# Patient Record
Sex: Female | Born: 1980 | Race: Black or African American | Hispanic: No | Marital: Single | Smoking: Never smoker
Health system: Southern US, Community
[De-identification: ages and names within clinical notes are randomized; demographics above are authoritative.]

## PROBLEM LIST (undated history)

## (undated) ENCOUNTER — Inpatient Hospital Stay (HOSPITAL_COMMUNITY): Payer: Self-pay

## (undated) DIAGNOSIS — T7840XA Allergy, unspecified, initial encounter: Secondary | ICD-10-CM

## (undated) DIAGNOSIS — N611 Abscess of the breast and nipple: Secondary | ICD-10-CM

## (undated) DIAGNOSIS — E282 Polycystic ovarian syndrome: Secondary | ICD-10-CM

## (undated) DIAGNOSIS — D649 Anemia, unspecified: Secondary | ICD-10-CM

## (undated) HISTORY — PX: BREAST SURGERY: SHX581

## (undated) HISTORY — DX: Polycystic ovarian syndrome: E28.2

## (undated) HISTORY — DX: Allergy, unspecified, initial encounter: T78.40XA

## (undated) HISTORY — DX: Abscess of the breast and nipple: N61.1

---

## 2006-09-30 ENCOUNTER — Emergency Department (HOSPITAL_COMMUNITY): Admission: EM | Admit: 2006-09-30 | Discharge: 2006-09-30 | Payer: Self-pay | Admitting: Emergency Medicine

## 2008-09-15 ENCOUNTER — Inpatient Hospital Stay (HOSPITAL_COMMUNITY): Admission: AD | Admit: 2008-09-15 | Discharge: 2008-09-15 | Payer: Self-pay | Admitting: Obstetrics

## 2009-03-04 ENCOUNTER — Inpatient Hospital Stay (HOSPITAL_COMMUNITY): Admission: AD | Admit: 2009-03-04 | Discharge: 2009-03-04 | Payer: Self-pay | Admitting: Obstetrics and Gynecology

## 2009-03-06 ENCOUNTER — Inpatient Hospital Stay (HOSPITAL_COMMUNITY): Admission: AD | Admit: 2009-03-06 | Discharge: 2009-03-06 | Payer: Self-pay | Admitting: Obstetrics and Gynecology

## 2009-03-15 ENCOUNTER — Inpatient Hospital Stay (HOSPITAL_COMMUNITY): Admission: AD | Admit: 2009-03-15 | Discharge: 2009-03-15 | Payer: Self-pay | Admitting: Obstetrics and Gynecology

## 2009-03-19 ENCOUNTER — Inpatient Hospital Stay (HOSPITAL_COMMUNITY): Admission: AD | Admit: 2009-03-19 | Discharge: 2009-03-22 | Payer: Self-pay | Admitting: Obstetrics and Gynecology

## 2009-03-19 ENCOUNTER — Encounter (INDEPENDENT_AMBULATORY_CARE_PROVIDER_SITE_OTHER): Payer: Self-pay | Admitting: Obstetrics and Gynecology

## 2010-09-22 ENCOUNTER — Emergency Department (HOSPITAL_COMMUNITY)
Admission: EM | Admit: 2010-09-22 | Discharge: 2010-09-22 | Payer: Self-pay | Source: Home / Self Care | Admitting: Emergency Medicine

## 2010-12-10 LAB — CBC
HCT: 38.8 % (ref 36.0–46.0)
MCV: 95.4 fL (ref 78.0–100.0)
MCV: 96.5 fL (ref 78.0–100.0)
Platelets: 300 10*3/uL (ref 150–400)
Platelets: 357 10*3/uL (ref 150–400)
RBC: 3.39 MIL/uL — ABNORMAL LOW (ref 3.87–5.11)
RDW: 13.2 % (ref 11.5–15.5)
WBC: 14.2 10*3/uL — ABNORMAL HIGH (ref 4.0–10.5)

## 2010-12-10 LAB — RPR: RPR Ser Ql: NONREACTIVE

## 2010-12-10 LAB — URINALYSIS, ROUTINE W REFLEX MICROSCOPIC
Bilirubin Urine: NEGATIVE
Glucose, UA: NEGATIVE mg/dL
Nitrite: NEGATIVE
Nitrite: NEGATIVE
Specific Gravity, Urine: >1.03 — ABNORMAL HIGH (ref 1.005–1.030)
pH: 5.5 (ref 5.0–8.0)
pH: 6 (ref 5.0–8.0)

## 2010-12-10 LAB — WET PREP, GENITAL

## 2010-12-10 LAB — URINE CULTURE

## 2010-12-10 LAB — URINE MICROSCOPIC-ADD ON: RBC / HPF: NONE SEEN RBC/hpf (ref <3)

## 2011-01-16 NOTE — H&P (Signed)
[name], [name]             ACCOUNT NO.:  [id number]   MEDICAL RECORD NO.:  [id number]          PATIENT TYPE:  INP   LOCATION:  9162                          [facility]   PHYSICIAN:  Osborn Coho, M.D.   DATE OF BIRTH:  08/07/1981   DATE OF ADMISSION:  03/19/2009  DATE OF DISCHARGE:                              HISTORY & PHYSICAL   HISTORY OF PRESENT ILLNESS:  The patient is a 30 year old gravida 1,  para 0, admitted to 47 and 0/7 weeks' gestation for elective induction  of labor secondary to postdates.  The patient reports occasional mild  uterine contractions.  The patient denies leakage of fluid or bleeding.  The patient reports her fetus has been moving normally.  The patient  reports intermittent irregular contractions over the last few weeks and  has been evaluated multiple times in maternity admissions since 39 weeks  for contractions.  Pregnancy remarkable for:  1. Transfer in at 21 weeks.  2. Negative group B strep.  3. Alopecia areata.  4. History of asthma.  5. History of migraines.  6. History of gonorrhea.  7. IODINE and LATEX allergy.  8. Failed 1-hour glucose tolerance test and normal 3-hour glucose      tolerance test.   The patient's pregnancy has been followed by the [facility].  History present pregnancy, the patient entered care at  [facility] at 21 weeks.  The patient reports that prior to that time she had  seen Dr. Gaynell Face for 2 visits only.  Anatomy ultrasound obtained at 21  weeks with normal fetal anatomy noted.  Prenatal labs were done at that  time as well.  Normal amniotic fluid present at her 21-week scan as  well.  At 25 weeks, the patient expressed concern about weight gain and  nutrition was discussed.  The patient with a history of migraines and  stated that she was not any having issues with migraines at that time.  The patient was undecided regarding H1N1 vaccine.  Appropriate growth in  the fundal height.   Ultrasound was re-obtained due to size greater than  dates and estimated fetal weight at the 51st percentile and normal  amniotic fluid.  Urine culture was obtained at that time and the culture  returned with E. coli and the patient was treated with a 7-day course of  Macrobid b.i.d.  At 28 weeks, the patient had 1-hour Glucola done.  The  patient also had 2+ leukocytes on urine and repeat culture was sent at  that time as well.  The patient's 28-week labs with Glucola results of  163, hemoglobin 11.7, and negative RPR, and the patient underwent 3-hour  Glucola at 32 weeks of gestation.  At 31 weeks, the patient's urine test  of cure with no gross.  At 33 weeks, the patient with complaints of a  boil on the inside of her right thigh.  The patient was seen and noted  to have healing entered tinnitus lesions.  The patient was treated with  Keflex b.i.d. for 7 days.  Cervix remained closed.  The patient  reported  loss of mucous plug at 33 weeks.  Three-hour Glucola results noted to be  within normal limits at 33 weeks.  At 35 weeks, ultrasound was obtained  for position and growth.  Placenta was noted to be posterior, amniotic  fluid index was within normal limits.  No other information noted at  that time.  Group B strep, gonorrhea and Chlamydia were obtained at 35  weeks.  At 36 weeks, the patient with complaints of increased work  distress.  The patient's gonorrhea, Chlamydia, and group B strep, RPR  were all negative.  At 37 weeks, the patient with no complaints.  At 37  weeks and 3 days, the patient with complaints of contractions and  possible leakage of fluid; however, the patient sterile speculum exam  was negative, and the patient with no cervical dilation at that time.  At 38 weeks, the patient was noted to have 1+ glycosuria and a random  CBG was obtained with a result of 150; however, the patient had eaten  immediately prior to visit with a bagel with cream cheese, bacon and   orange juice.  Reactive NST was obtained at that date due to decreased  fetal movement.  Biophysical was also obtained with results of 8/10.  Amniotic fluid index was at the 90th percentile with an AFI of 22 at 38  weeks.  At 38 weeks and 4 days, the patient with a complaint of  recurrent boil at the beginning of her inner thigh.  The patient was  started on Keflex as well as warm compresses.  At 39 weeks, the patient  was seen in maternity admissions, evaluated for possible spontaneous  rupture of membranes, which was negative.  The patient was treated for  bacterial vaginosis at that time.  Urine culture was also obtained.  The  patient was treated with Flagyl for bacterial vaginosis.  At 39 weeks  and 4 days, the patient with complaints of irregular contractions and  had been seen several times for rule out labor.  The patient's cervical  exam at that time 1-2, 60-70% and -2 station.  At 40 weeks and 2 days,  the patient with continued complaints of irregular contractions.  The  patient's cervix found to be 3 cm dilated, 70% effaced, -2 station, and  vertex with bulging membranes.  The patient's membranes first left at  that point in time.  At that time, the patient expressed desire for  induction of labor and the patient was posted for induction of labor on  March 19, 2009.  The patient's total weight gain during this pregnancy 32  pounds.  The patient's [place] Day on March 12, 2009 and that was established by  ultrasound.   OB HISTORY:  Pregnancy #1 is current.   GYN HISTORY:  The patient with a history of gonorrhea 7 years ago and it  was treated.  The patient with no other history of abnormal Pap or STDs.  The patient with regular monthly menses and no history of contraceptives  use.   PAST MEDICAL HISTORY:  The patient with a history of asthma with rare  issues.  At present; however, she does have a rescue inhaler available  for use.  The patient also with a history of alopecia areata  since  birth.  The patient also with a history of migraine headaches with no  prophylaxis and no current issues.  The patient also with some anxiety  with pelvic exams.   PAST SURGICAL HISTORY:  Negative.   HOSPITALIZATIONS:  Negative.   CURRENT MEDICATIONS:  Prenatal vitamins.   ALLERGIES:  The patient is allergic to IODINE, SHELL FISH, and LATEX.   FAMILY HISTORY:  The patient and her other family members also with  alopecia areata, but no specific family members are not noted.   GENETIC HISTORY:  Negative with exception of alopecia areata.   SOCIAL HISTORY:  The patient is single.  The patient works as a Advice worker.  The patient with associate degree education.  The patient  denies use of tobacco, alcohol, or street drugs.  The patient reports no  particular religious affiliation.  Father of the baby, Nunzio Cobbs,  is involved and supportive.  Father of the baby with associate degree  education and at present is unemployed.  The patient's domestic violence  screen is negative.   OBJECTIVE:  VITAL SIGNS:  Blood pressure 120/59, pulse 110, respirations  16, and heart rate regular.  The patient is afebrile.  GENERAL:  The patient is alert and oriented.  No apparent distress.  SKIN:  Warm and dry.  Color satisfactory.  HEENT:  Within normal limits.  THYROID:  Not enlarged.  HEART:  Regular rate and rhythm.  LUNGS:  Clear.  BREASTS:  Soft.  ABDOMEN:  Soft and gravid and nontender.  Fundal height 41 cm.  Fetus is  vertex to [facility] maneuver and vertex presentation confirmed with brief  bedside ultrasound as well.  Estimated fetal weight 8-1/2 pound.  Fetal  heart rate baseline 140 and variability is present.  No decelerations  are noted.  Accelerations are present and fetal heart rate has reactive  fetal heart rate tracing.  Uterine contractions are noted per toco every  4-9 minutes and mild to palpation.  Sterile vaginal exam of the cervix  found cervix 3 cm  dilated, 60% effaced, -2 station, vertex and then  position with a Bishop score of 8.  Vertex not well applied to the  cervix at the present time.  EXTREMITIES:  Negative edema.  Negative  Homans bilaterally.  Deep tendon reflexes are 2+ with no clonus.   ASSESSMENT:  1. Intrauterine pregnancy at 96 and 0/7 weeks.  2. Induction of labor secondary to postdates.   PLAN:  Consult was obtained from Dr. Su Hilt.  The patient to be  admitted to birthing suite for induction of labor due to postdates.  The  risks, benefits, alternatives, and induction of labor were discussed  with the patient including increased risk of cesarean section and the  patient desires to proceed at the present time.  The patient will be  started on low-dose Pitocin.  Routine CNM orders.  The patient desires  epidural during labor.      Taylor Klein, CNM      Osborn Coho, M.D.  Electronically Signed    NOS/MEDQ  D:  03/19/2009  T:  03/19/2009  Job:  [id number]

## 2011-01-16 NOTE — Op Note (Signed)
NAMESEPTEMBER, Taylor Klein             ACCOUNT NO.:  [id number]   MEDICAL RECORD NO.:  [id number]          PATIENT TYPE:  INP   LOCATION:  9125                          [facility]   PHYSICIAN:  Osborn Coho, M.D.   DATE OF BIRTH:  [birth year]   DATE OF PROCEDURE:  03/19/2009  DATE OF DISCHARGE:                               OPERATIVE REPORT   PREOPERATIVE DIAGNOSES:  1. Term intrauterine pregnancy.  2. Postdates induction.  3. Failure to progress.   POSTOPERATIVE DIAGNOSES:  1. Term intrauterine pregnancy.  2. Postdates induction.  3. Failure to progress.   PROCEDURE:  Primary low transverse cesarean section.   ATTENDING:  Osborn Coho, MD   ANESTHESIA:  Failed epidural, failed spinal, ultimately required general  via endotracheal tube.   FINDINGS:  Live female infant with Apgars of 9 at 1 minute and 9 at 5  minutes, weighing 6 pounds 6 ounces.   SPECIMEN TO PATHOLOGY:  Placenta.   FLUIDS:  2300 mL.   URINE OUTPUT:  70 mL.   ESTIMATED BLOOD LOSS:  700 mL.   COMPLICATIONS:  None.   DESCRIPTION OF PROCEDURE:  The patient was taken to the operating room  after the risks, benefits, and alternatives were discussed with the  patient.  The patient verbalized understanding and consent signed and  witnessed.  The patient was ultimately placed under general after the  epidural was noted to not be working adequately and a spinal was  attempted and failed as well.  The patient was already prepped and  draped in the normal sterile fashion.  A Pfannenstiel skin incision was  made and carried down to underlying layer of fascia with the scalpel.  The fascia was excised bilaterally in the midline with a scalpel,  extended bilaterally with the scalpel.  The Kocher clamps were placed on  the superior aspect of fascial incision and the rectus muscle excised  from the fascia.  The same was done on the inferior aspect of the  fascial incision.  The muscle was separated in midline and  the  peritoneum entered bluntly and extended manually.  The bladder blade was  placed and bladder flap created with the Metzenbaum scissors.  Uterine  incision was made with a scalpel, extended bilaterally with the bandage  scissors.  The infant was delivered in vertex presentation and the cord  was clamped and cut, and the infant handed to awaiting pediatricians.  The placenta was removed via fundal massage and the uterus cleared of  all clots and debris.  The uterine incision was repaired with 0 Vicryl  via a running interlocking stitch and a second imbricating layer was  performed.  A small approximately 1.5 cm extension was noted on the  right aspect of the uterine incision, which was repaired with 0 Vicryl  via a running interlocking stitch and a second imbricating layer was  performed as well.  This was done prior to placing the second  imbricating layer of the primary incision.  The intra-abdominal cavity  was copiously irrigated and normal-appearing bilateral ovaries and  fallopian tubes were noted.  The uterine incision  was noted to be  hemostatic.  The peritoneum was entered with 2-0 chromic via a running  stitch.  The fascia was repaired via 0 Vicryl via a running stitch.  The  subcutaneous tissue was irrigated and made hemostatic with the Bovie.  A  JP drain was placed and subcutaneous tissue was reapproximated using 3  interrupted stitches of 2-0 plain.  The skin was reapproximated using 3-  0 Monocryl via a subcuticular stitch.  The JP drain was sutured down  using 2-0 silk.  Steri-Strips were applied with Benzoin.  Sponge, lap,  and needle count was correct.  The patient tolerated the procedure well  and was awaiting extubation and transfer to the recovery room in good  condition.       Osborn Coho, M.D.  Electronically Signed     AR/MEDQ  D:  03/19/2009  T:  03/20/2009  Job:  [id number]

## 2011-01-16 NOTE — Discharge Summary (Signed)
NAMEMAKINZE, JANI             ACCOUNT NO.:  [id number]   MEDICAL RECORD NO.:  [id number]          PATIENT TYPE:  INP   LOCATION:  9125                          [facility]   PHYSICIAN:  Osborn Coho, M.D.   DATE OF BIRTH:  [birth year]   DATE OF ADMISSION:  03/19/2009  DATE OF DISCHARGE:  03/22/2009                               DISCHARGE SUMMARY   ADMITTING DIAGNOSES:  1. Intrauterine pregnancy at 41 weeks.  2. Induction of labor secondary to post dates.   DISCHARGE DIAGNOSES:  1. Intrauterine pregnancy at term.  2. Failure to progress.   PROCEDURES:  1. Primary low-transverse cesarean section.  2. Failed induction.  3. Failed epidural.  4. Failed spinal and general anesthesia.   HOSPITAL COURSE:  Ms. Behringer is a 30 year old, gravida 1, para 0, who  was admitted on March 19, 2009 in the morning for induction secondary to  post dates.  Her pregnancy had been remarkable for:  1. Transfer of care to [facility] at 21 weeks.  2. Alopecia areata.  3. Negative group B strep.  4. Asthma.  5. History of migraines.  6. History of gonorrhea in the past.  7. IODINE and LATEX allergy.  8. Failed 1-hour GTT, but normal 3-hour GTT.   On admission, cervix was 360% vertex at a -2 station.  Fetal heart rate  was reactive.  Contractions every 4-9 minutes.  The patient was begun on  Pitocin that morning.  Contractions became stronger over the early  afternoon.  She was requesting an epidural by approximately 3:00 p.m.  She was on 10 units of Pitocin at that time.  Fetal heart rate was  reactive and contractions every 2-3 minutes.  Epidural was placed and  the patient was 4-5 80% vertex at a -2 station.  Bulging bag of water  was noted.  Artificial rupture of membranes was accomplished with clear  fluid and a pressure monitor was inserted without difficulty.  Over the  next 2-3 hours, the patient began to have significant neck pain, since  her epidural placement and  epidural was turned off.  The patient was  still 4-5 days and she had been adequate since insertion of the IUPC at  3:30.  Anesthesia was consulted, at the time, the cervix was 5-6 at 8:00  p.m., 80% vertex at a -2 station.  Fetal heart rate was reactive.  There  were some mild variables noted.  The patient had been inadequate labor  for several hours without cervical change.  Options were reviewed with  the patient including continued induction of labor versus C-section for  failure to progress.  The patient started to proceed with C-section at  that time.  Dr. Su Hilt was notified it and that was notified.  She came  in performed a primary low-transverse cesarean section.  The epidural  that was present failed for adequacy of use.  The spinal that was  attempted also failed, therefore, the patient had to have general  anesthesia.  Findings were a viable female, weight 6 pounds 6 ounces.  Apgars were 9 and 9.  Infant  was taken to the full-term nursery.  Mother  was taken to recovery in good condition.  By postop day 1, the patient  was doing well.  She was having good pain control with p.o. pain meds.  Her hemoglobin was 11.3, white blood cell count was 18.6, and platelet  count was 300.  Her pre delivery hemoglobin had been 13.5.  Her physical  exam was within normal limits.  She had a JP drain in place that was  draining a small amount of serosanguineous fluid.  The dressing was  clean, dry, and intact.  Her lochia was scant and her fundus was firm.  She was working on breast-feeding.  Her Foley was removed without  difficulty.  Over the rest of her hospital stay, she continued to work  on her breast-feeding.  She did not have any issues of syncope or  dizziness.  She was ambulating well without difficulty.  By postop day  3, she was up ad lib.  The drain was removed without difficulty.  She  was planning to use Micronor at present, since she was still working on  breast-feeding.  Her  incision was clean, dry, and intact with Steri-  Strips in place.  Her lochia was scant.  Her fundus was firm.  She was  deemed to receive full-benefit of her hospital stay and was discharged  home.   DISCHARGE INSTRUCTIONS:  Per [facility] handout.   DISCHARGE MEDICATIONS:  Motrin 600 mg p.o. q.6 h p.r.n. pain, Percocet  5/325 one to two p.o. daily 3-4 hours p.r.n. pain, Micronor 1 p.o.  daily.  The patient will also notify us if she decides to stop breast-  feeding first to make it change in her birth control pills.  Discharge  followup will occur in 6 weeks [facility].      Renaldo Reel Emilee Hero, C.N.M.      Osborn Coho, M.D.  Electronically Signed    VLL/MEDQ  D:  03/22/2009  T:  03/23/2009  Job:  [id number]

## 2011-03-06 ENCOUNTER — Ambulatory Visit: Payer: Self-pay

## 2011-03-06 ENCOUNTER — Other Ambulatory Visit: Payer: Self-pay | Admitting: Occupational Medicine

## 2011-03-06 DIAGNOSIS — R52 Pain, unspecified: Secondary | ICD-10-CM

## 2011-07-03 ENCOUNTER — Emergency Department (HOSPITAL_COMMUNITY)
Admission: EM | Admit: 2011-07-03 | Discharge: 2011-07-04 | Disposition: A | Discharge status: Home / Self Care | Payer: Self-pay | Attending: Emergency Medicine | Admitting: Emergency Medicine

## 2011-07-03 DIAGNOSIS — N644 Mastodynia: Secondary | ICD-10-CM | POA: Insufficient documentation

## 2011-07-03 DIAGNOSIS — J45909 Unspecified asthma, uncomplicated: Secondary | ICD-10-CM | POA: Insufficient documentation

## 2011-07-03 DIAGNOSIS — N63 Unspecified lump in unspecified breast: Secondary | ICD-10-CM | POA: Insufficient documentation

## 2011-07-04 LAB — CBC
HCT: 38.5 % (ref 36.0–46.0)
MCH: 27.4 pg (ref 26.0–34.0)
MCHC: 30.9 g/dL (ref 30.0–36.0)
RDW: 12.6 % (ref 11.5–15.5)

## 2011-07-04 LAB — DIFFERENTIAL
Basophils Absolute: 0 10*3/uL (ref 0.0–0.1)
Basophils Relative: 0 % (ref 0–1)
Eosinophils Relative: 2 % (ref 0–5)
Lymphocytes Relative: 20 % (ref 12–46)
Monocytes Absolute: 0.7 10*3/uL (ref 0.1–1.0)
Monocytes Relative: 6 % (ref 3–12)

## 2011-07-05 ENCOUNTER — Other Ambulatory Visit: Payer: Self-pay | Admitting: Obstetrics and Gynecology

## 2011-07-05 DIAGNOSIS — N63 Unspecified lump in unspecified breast: Secondary | ICD-10-CM

## 2011-07-05 DIAGNOSIS — N644 Mastodynia: Secondary | ICD-10-CM

## 2011-07-09 ENCOUNTER — Ambulatory Visit
Admission: RE | Admit: 2011-07-09 | Discharge: 2011-07-09 | Disposition: A | Discharge status: Home / Self Care | Payer: BC Managed Care – PPO | Source: Ambulatory Visit | Attending: Obstetrics and Gynecology | Admitting: Obstetrics and Gynecology

## 2011-07-09 ENCOUNTER — Other Ambulatory Visit: Payer: Self-pay | Admitting: Diagnostic Radiology

## 2011-07-09 ENCOUNTER — Other Ambulatory Visit: Payer: Self-pay | Admitting: Obstetrics and Gynecology

## 2011-07-09 DIAGNOSIS — N63 Unspecified lump in unspecified breast: Secondary | ICD-10-CM

## 2011-07-09 DIAGNOSIS — N644 Mastodynia: Secondary | ICD-10-CM

## 2011-08-14 ENCOUNTER — Encounter (INDEPENDENT_AMBULATORY_CARE_PROVIDER_SITE_OTHER): Payer: Self-pay | Admitting: Surgery

## 2011-08-14 ENCOUNTER — Ambulatory Visit (INDEPENDENT_AMBULATORY_CARE_PROVIDER_SITE_OTHER): Payer: BC Managed Care – PPO | Admitting: Surgery

## 2011-08-14 ENCOUNTER — Other Ambulatory Visit (INDEPENDENT_AMBULATORY_CARE_PROVIDER_SITE_OTHER): Payer: Self-pay | Admitting: Surgery

## 2011-08-14 VITALS — BP 132/90 | HR 88 | Temp 98.2°F | Resp 16 | Ht 60.0 in | Wt 204.2 lb

## 2011-08-14 DIAGNOSIS — N61 Mastitis without abscess: Secondary | ICD-10-CM

## 2011-08-14 DIAGNOSIS — N611 Abscess of the breast and nipple: Secondary | ICD-10-CM | POA: Insufficient documentation

## 2011-08-14 MED ORDER — HYDROCODONE-ACETAMINOPHEN 5-500 MG PO TABS: 1.0000 | ORAL_TABLET | Freq: Every day | ORAL | Status: AC

## 2011-08-14 NOTE — Patient Instructions (Addendum)
Mastitis   Mastitis is a bacterial infection of the breast tissue.  CAUSES   Bacteria causes infection by entering the breast tissue through cuts or openings in the skin. Typically, this occurs with breastfeeding due to cracked or irritated skin. It can be associated with plugged ducts. Nipple piercing can also lead to mastitis.  SYMPTOMS   In mastitis, an area of the breast becomes swollen, red, tender, and painful. You may notice you have a fever and swelling of the glands under your arm on that side. If the infection is allowed to progress, a collection of pus (abscess) may develop.  DIAGNOSIS   Your caregiver can diagnose mastitis based on your symptoms and upon examination. The diagnosis can be confirmed if pus can be expressed from the breast. This pus can be examined in the lab to determine which bacteria are present. If an abscess has developed, the fluid in the abscess can be removed with a needle. This is used to confirm the diagnosis and determine the bacteria present. In most cases, pus will not be present. Blood tests can be done to determine if your body is fighting a bacterial infection. Sometimes, a mammogram or ultrasound will be recommended to exclude other breast diseases including cancer.  Other rare forms of mastitis:   Tuberculosis mastitis is rare. The TB germ can affect the breast if it is present in some other part of the body. The breast may be slightly tender with a mass, but not tender or painful.   Syphilis of the nipple usually has an ulcer that is not tender.   Actinomycosis is a very rare bacterial infection of the breast that presents as a mass in the breast that is not tender or painful.   Phlebitis (inflammation of blood vessels) of the breast is an inflammation of the veins in the breast. It may be caused by tight fitting bras, surgery, or trauma to the breast.   Inflammatory carcinoma of the breast looks like mastitis because the breasts are red, swollen, or tender, but it  is a rare form of breast cancer.  TREATMENT   Antibiotic medication is used to treat the bacterial infection. Your caregiver will determine which bacteria are most likely to be causing the infection and select an antibiotic. This is sometimes changed based on the results of cultures, or if there is no response to the antibiotic selected. Antibiotics are usually given by mouth. If you are breastfeeding, it is important to continue to empty the breast. Your caregiver can tell you whether or not this milk is safe for your infant, or needs to be thrown away. Pain can usually be treated with medication.  HOME CARE INSTRUCTIONS    Take your antibiotics as directed. Finish them even if you start to feel better.   Only take over-the-counter or prescription medication for pain, discomfort, or fever as directed by your caregiver.   If breastfeeding, keep your nipples clean and dry. Your caregiver may tell you to stop nursing until he or she feels it is safe for your baby. Use a breast pump as instructed if forced to stop nursing.   Do not wear a tight bra. Wear a good support bra.   Empty the first breast completely before going to the other breast. If your baby is not emptying your breasts completely for some reason, use a breast pump to empty your breasts.   If you go back to work, pump your breasts while at work to stay in time   you have a fever.   Avoid having your breasts get overly filled with milk (engorged).  SEEK MEDICAL CARE IF:   You develop pus-like (purulent) discharge from the breast.   Your symptoms get worse.   You do not seem to be responding to your treatment within 2 days.  SEEK IMMEDIATE MEDICAL CARE IF:   You have a fever.   Your pain and swelling is getting worse.   You develop pain that is not controlled with medicine.   You develop a red line extending from the breast toward your armpit.  Document  Released: 08/20/2005 Document Revised: 05/02/2011 Document Reviewed: 04/09/2008 [facility] Patient Information 2012 [place], [place].   REMOVE PACKING IN 48 HOURS.

## 2011-08-14 NOTE — Progress Notes (Signed)
Subjective:     Patient ID: Taylor Klein, female   DOB: 07-Dec-1980, 30 y.o.   MRN: [id number]  HPIThe patient presents today at the request of Dr. Deboraha Sprang due to right breast abscess. She has had an abscess of the last 2 months. She underwent aspiration but multilobar the radiologist the final pathology showed Staphylococcus aureus chronic inflammation. She has been on and off antibiotics but it will not resolve. It is located in the right breast. It causes pain and swelling. She has had no drainage.   Review of Systems  Constitutional: Negative.   HENT: Negative.   Eyes: Negative.   Respiratory: Negative.   Cardiovascular: Negative.   Gastrointestinal: Negative.        Objective:   Physical Exam  Constitutional: She is oriented to person, place, and time. She appears well-developed and well-nourished.  HENT:  Head: Normocephalic and atraumatic.  Eyes: EOM are normal. Pupils are equal, round, and reactive to light.  Pulmonary/Chest:       Right breast examined. In the upper-inner quadrant is a 3 cm area of swelling. There is no redness but there is tenderness. Ultrasound was done. Using a 10 MHz probe I was able to visualize a 3 x 5 cm fluid cavity consistent with abscess.  Neurological: She is alert and oriented to person, place, and time.       Assessment:     Chronic right breast abscess    Plan:     I recommend incision and drainage today in the office with. Patient was done for guidance. Images using the ultrasound were obtained. After discussing the procedure with the patient as well as alternatives, she agreed to proceed. Risk of bleeding, infection recurrence discussed as well as any further surgery. Right breast was prepped with alcohol. We'll do some lidocaine with epinephrine was usual local anesthesia injected about the 1:00 position over the area of swelling. Incision was made. Abscess cavities into the most fluid was bloody and clear. This was cultured. The cavity  packed open with quarter-inch packing. Dry dressings applied. She tolerated the procedure well. She will return to clinic in one week. Remove packing in 48 hours. Continue antibiotics.

## 2011-08-18 LAB — CULTURE, ROUTINE-ABSCESS: Organism ID, Bacteria: NO GROWTH

## 2011-08-23 ENCOUNTER — Ambulatory Visit (INDEPENDENT_AMBULATORY_CARE_PROVIDER_SITE_OTHER): Payer: BC Managed Care – PPO | Admitting: Surgery

## 2011-08-23 ENCOUNTER — Encounter (INDEPENDENT_AMBULATORY_CARE_PROVIDER_SITE_OTHER): Payer: Self-pay | Admitting: Surgery

## 2011-08-23 VITALS — BP 118/76 | HR 68 | Temp 97.8°F | Resp 18 | Ht 60.0 in | Wt 206.0 lb

## 2011-08-23 DIAGNOSIS — N61 Mastitis without abscess: Secondary | ICD-10-CM

## 2011-08-23 NOTE — Patient Instructions (Signed)
Return to clinic if symptoms worsens.

## 2011-08-23 NOTE — Progress Notes (Signed)
The patient returns to the in follow up of right breast abscess and cellulitis. She's doing better. She denies any fever or chills or significant pain. She does have some fullness around the incision and drainage site.  Exam: Right breast shows no redness. The incision and drainage wound is clean without signs of abscess.  Impression right breast cellulitis with history of abscess status post incision and drainage and office  Plan: Complete antibiotics and return to clinic if symptoms worsen.

## 2014-12-31 ENCOUNTER — Ambulatory Visit (INDEPENDENT_AMBULATORY_CARE_PROVIDER_SITE_OTHER): Payer: Self-pay | Admitting: Family Medicine

## 2014-12-31 ENCOUNTER — Ambulatory Visit (HOSPITAL_COMMUNITY)
Admission: RE | Admit: 2014-12-31 | Discharge: 2014-12-31 | Disposition: A | Discharge status: Home / Self Care | Payer: Self-pay | Source: Ambulatory Visit | Attending: Physician Assistant | Admitting: Physician Assistant

## 2014-12-31 ENCOUNTER — Telehealth: Payer: Self-pay | Admitting: Physician Assistant

## 2014-12-31 VITALS — BP 120/76 | HR 100 | Temp 98.0°F | Resp 18 | Ht 61.0 in | Wt 214.0 lb

## 2014-12-31 DIAGNOSIS — R1032 Left lower quadrant pain: Secondary | ICD-10-CM | POA: Insufficient documentation

## 2014-12-31 DIAGNOSIS — Z3490 Encounter for supervision of normal pregnancy, unspecified, unspecified trimester: Secondary | ICD-10-CM

## 2014-12-31 DIAGNOSIS — R1084 Generalized abdominal pain: Secondary | ICD-10-CM

## 2014-12-31 DIAGNOSIS — O208 Other hemorrhage in early pregnancy: Secondary | ICD-10-CM | POA: Insufficient documentation

## 2014-12-31 DIAGNOSIS — R5383 Other fatigue: Secondary | ICD-10-CM

## 2014-12-31 DIAGNOSIS — O3421 Maternal care for scar from previous cesarean delivery: Secondary | ICD-10-CM | POA: Insufficient documentation

## 2014-12-31 DIAGNOSIS — Z3201 Encounter for pregnancy test, result positive: Secondary | ICD-10-CM

## 2014-12-31 DIAGNOSIS — O9989 Other specified diseases and conditions complicating pregnancy, childbirth and the puerperium: Secondary | ICD-10-CM | POA: Insufficient documentation

## 2014-12-31 DIAGNOSIS — O26811 Pregnancy related exhaustion and fatigue, first trimester: Secondary | ICD-10-CM | POA: Insufficient documentation

## 2014-12-31 DIAGNOSIS — N3000 Acute cystitis without hematuria: Secondary | ICD-10-CM

## 2014-12-31 DIAGNOSIS — Z3A09 9 weeks gestation of pregnancy: Secondary | ICD-10-CM | POA: Insufficient documentation

## 2014-12-31 LAB — POCT URINE PREGNANCY: Preg Test, Ur: POSITIVE

## 2014-12-31 LAB — POCT URINALYSIS DIPSTICK
Bilirubin, UA: NEGATIVE
GLUCOSE UA: NEGATIVE
KETONES UA: NEGATIVE
Nitrite, UA: NEGATIVE
Protein, UA: NEGATIVE
Spec Grav, UA: >=1.03
Urobilinogen, UA: 0.2
pH, UA: 6

## 2014-12-31 LAB — POCT CBC
GRANULOCYTE PERCENT: 77.6 % (ref 37–80)
HCT, POC: 38.4 % (ref 37.7–47.9)
HEMOGLOBIN: 12.9 g/dL (ref 12.2–16.2)
Lymph, poc: 2.4 (ref 0.6–3.4)
MCH: 29.8 pg (ref 27–31.2)
MCHC: 33.5 g/dL (ref 31.8–35.4)
MCV: 89 fL (ref 80–97)
MID (cbc): 0.4 (ref 0–0.9)
MPV: 6.6 fL (ref 0–99.8)
PLATELET COUNT, POC: 408 10*3/uL (ref 142–424)
POC GRANULOCYTE: 9.8 — AB (ref 2–6.9)
POC LYMPH PERCENT: 19.2 %L (ref 10–50)
POC MID %: 3.2 % (ref 0–12)
RBC: 4.21 M/uL (ref 4.04–5.48)
RDW, POC: 13.2 %
WBC: 12.6 10*3/uL — AB (ref 4.6–10.2)

## 2014-12-31 LAB — POCT UA - MICROSCOPIC ONLY
CRYSTALS, UR, HPF, POC: NEGATIVE
Casts, Ur, LPF, POC: NEGATIVE
Yeast, UA: NEGATIVE

## 2014-12-31 MED ORDER — AMOXICILLIN-POT CLAVULANATE 875-125 MG PO TABS: 1.0000 | ORAL_TABLET | Freq: Two times a day (BID) | ORAL | Status: DC

## 2014-12-31 NOTE — Patient Instructions (Signed)
"  Your Pregnancy Week by Week" by Lindwood QuaGlade Curtis Start prenatal vitamins. Increase water intake to 64 oz daily.

## 2014-12-31 NOTE — Progress Notes (Signed)
Subjective:    Patient ID: Taylor Klein, female    DOB: [birth year], 34 y.o.   MRN: [id number]  HPI Patient with PMH of PCOS presents for generalized abdominal pain that has been present for 2 weeks that is accompanied by fatigue. Says that walk/jogged a 5 K two weeks ago and that was the day she initially noticed pain. Pain is crampy in quality and is intermittent. Notices pain 3-4 days of the week and comes and goes suddenly. Food does not make better or worse. Denies new/raw foods, recent antibiotic use, and sick contacts. H/o kidney stones over 10 years ago and fibroids, but this does not feel like fibroid pain. No h/o IBS, acid reflux, DM, or thyroid dz. Only abdominal surgery is C-section 5 years ago. LMP x 2 months ago, but that is not unusual for her has she has PCOS. Took 2 pregnancy test over past week and one was positive and the other was negative. Endorses increased flatulence. Denies nausea, vomiting, diarrhea, constipation, fever, urinary sx, dyspareunia, vaginal d/c, HA/dizziness. Allergic to iodine.    Review of Systems  Constitutional: Positive for fatigue. Negative for fever, chills, activity change and appetite change.  Gastrointestinal: Positive for abdominal pain. Negative for nausea, vomiting, diarrhea, constipation, blood in stool and abdominal distention.  Genitourinary: Positive for menstrual problem. Negative for dysuria, urgency, frequency, hematuria, flank pain, decreased urine volume, vaginal bleeding, vaginal discharge, difficulty urinating, vaginal pain, pelvic pain and dyspareunia.  Musculoskeletal: Negative for back pain.  Neurological: Negative for dizziness and headaches.       Objective:   Physical Exam  Constitutional: She is oriented to person, place, and time. She appears well-developed and well-nourished. No distress.  Blood pressure 120/76, pulse 100, temperature 98 F (36.7 C), temperature source Oral, resp. rate 18, height 5\' 1"  (1.549 m), weight  214 lb (97.07 kg), SpO2 97 %.  HENT:  Head: Normocephalic and atraumatic.  Right Ear: External ear normal.  Left Ear: External ear normal.  Eyes: Conjunctivae are normal. Pupils are equal, round, and reactive to light. Right eye exhibits no discharge. Left eye exhibits no discharge. No scleral icterus.  Neck: Normal range of motion. Neck supple. No thyromegaly present.  Cardiovascular: Normal rate, regular rhythm and normal heart sounds.  Exam reveals no gallop and no friction rub.   No murmur heard. Pulmonary/Chest: Effort normal and breath sounds normal. No respiratory distress. She has no wheezes. She has no rales.  Abdominal: Soft. Bowel sounds are normal. She exhibits no distension and no mass. There is tenderness in the periumbilical area. There is no rigidity, no rebound, no guarding, no CVA tenderness, no tenderness at McBurney's point and negative Murphy's sign. No hernia.  Lymphadenopathy:    She has no cervical adenopathy.  Neurological: She is alert and oriented to person, place, and time.  Skin: Skin is warm and dry. No rash noted. She is not diaphoretic. No erythema. No pallor.   Results for orders placed or performed in visit on 12/31/14  POCT UA - Microscopic Only  Result Value Ref Range   WBC, Ur, HPF, POC 4-8    RBC, urine, microscopic 0-2    Bacteria, U Microscopic trace    Mucus, UA small    Epithelial cells, urine per micros 15-tntc    Crystals, Ur, HPF, POC neg    Casts, Ur, LPF, POC neg    Yeast, UA neg    Amorphous large   POCT urinalysis dipstick  Result Value Ref Range  Color, UA yellow    Clarity, UA cloudy    Glucose, UA neg    Bilirubin, UA neg    Ketones, UA neg    Spec Grav, UA >=1.030    Blood, UA nef    pH, UA 6.0    Protein, UA neg    Urobilinogen, UA 0.2    Nitrite, UA neg    Leukocytes, UA small (1+)   POCT urine pregnancy  Result Value Ref Range   Preg Test, Ur Positive   POCT CBC  Result Value Ref Range   WBC 12.6 (A) 4.6 - 10.2  K/uL   Lymph, poc 2.4 0.6 - 3.4   POC LYMPH PERCENT 19.2 10 - 50 %L   MID (cbc) 0.4 0 - 0.9   POC MID % 3.2 0 - 12 %M   POC Granulocyte 9.8 (A) 2 - 6.9   Granulocyte percent 77.6 37 - 80 %G   RBC 4.21 4.04 - 5.48 M/uL   Hemoglobin 12.9 12.2 - 16.2 g/dL   HCT, POC 16.1 09.6 - 47.9 %   MCV 89.0 80 - 97 fL   MCH, POC 29.8 27 - 31.2 pg   MCHC 33.5 31.8 - 35.4 g/dL   RDW, POC 04.5 %   Platelet Count, POC 408 142 - 424 K/uL   MPV 6.6 0 - 99.8 fL      Assessment & Plan:  1. Pregnant 2. Generalized abdominal pain Should increase fluid intake to 64 oz daily and start prenatal vitamins if pregnancy viable.  - POCT UA - Microscopic Only - POCT urinalysis dipstick - POCT urine pregnancy - US Pelvis Complete; Future - US OB Transvaginal; Future - US OB Comp Less 14 Wks; Future  3. Acute cystitis without hematuria - amoxicillin-clavulanate (AUGMENTIN) 875-125 MG per tablet; Take 1 tablet by mouth 2 (two) times daily.  Dispense: 20 tablet; Refill: 0 - Urine culture  4. Other fatigue - POCT urine pregnancy - POCT CBC   Labrea Eccleston PA-C  [facility] 12/31/2014 1:07 PM

## 2014-12-31 NOTE — Telephone Encounter (Signed)
Unable to leave vmail as it is not set up.

## 2015-01-01 ENCOUNTER — Telehealth: Payer: Self-pay | Admitting: Physician Assistant

## 2015-01-01 DIAGNOSIS — Z86018 Personal history of other benign neoplasm: Secondary | ICD-10-CM

## 2015-01-01 DIAGNOSIS — Z3490 Encounter for supervision of normal pregnancy, unspecified, unspecified trimester: Secondary | ICD-10-CM

## 2015-01-01 DIAGNOSIS — R9389 Abnormal findings on diagnostic imaging of other specified body structures: Secondary | ICD-10-CM

## 2015-01-01 NOTE — Telephone Encounter (Signed)
Discussed US results with patient. Confirmed viable pregnancy. Will send referral for ob/gyn as possible blood noted on ultrasound and needs f/u US along with her history of fibroids and abdominal pain.

## 2015-03-25 LAB — OB RESULTS CONSOLE GC/CHLAMYDIA
Chlamydia: NEGATIVE
GC PROBE AMP, GENITAL: NEGATIVE

## 2015-03-25 LAB — OB RESULTS CONSOLE ANTIBODY SCREEN: ANTIBODY SCREEN: NEGATIVE

## 2015-03-25 LAB — OB RESULTS CONSOLE RUBELLA ANTIBODY, IGM: RUBELLA: IMMUNE

## 2015-03-25 LAB — OB RESULTS CONSOLE ABO/RH: RH Type: POSITIVE

## 2015-03-25 LAB — OB RESULTS CONSOLE HEPATITIS B SURFACE ANTIGEN: Hepatitis B Surface Ag: NEGATIVE

## 2015-03-25 LAB — OB RESULTS CONSOLE HIV ANTIBODY (ROUTINE TESTING): HIV: NONREACTIVE

## 2015-03-25 LAB — OB RESULTS CONSOLE RPR: RPR: NONREACTIVE

## 2015-05-27 ENCOUNTER — Other Ambulatory Visit: Payer: Self-pay | Admitting: Obstetrics and Gynecology

## 2015-05-31 ENCOUNTER — Other Ambulatory Visit: Payer: Self-pay | Admitting: Obstetrics and Gynecology

## 2015-07-14 ENCOUNTER — Inpatient Hospital Stay (HOSPITAL_COMMUNITY)
Admission: AD | Admit: 2015-07-14 | Discharge: 2015-07-15 | Disposition: A | Discharge status: Home / Self Care | Payer: Medicaid Other | Source: Ambulatory Visit | Attending: Obstetrics and Gynecology | Admitting: Obstetrics and Gynecology

## 2015-07-14 ENCOUNTER — Encounter (HOSPITAL_COMMUNITY): Payer: Self-pay | Admitting: *Deleted

## 2015-07-14 DIAGNOSIS — O99013 Anemia complicating pregnancy, third trimester: Secondary | ICD-10-CM | POA: Insufficient documentation

## 2015-07-14 DIAGNOSIS — Z3A37 37 weeks gestation of pregnancy: Secondary | ICD-10-CM | POA: Diagnosis not present

## 2015-07-14 DIAGNOSIS — R112 Nausea with vomiting, unspecified: Secondary | ICD-10-CM

## 2015-07-14 DIAGNOSIS — O212 Late vomiting of pregnancy: Secondary | ICD-10-CM | POA: Diagnosis not present

## 2015-07-14 DIAGNOSIS — O26893 Other specified pregnancy related conditions, third trimester: Secondary | ICD-10-CM | POA: Diagnosis present

## 2015-07-14 LAB — CBC WITH DIFFERENTIAL/PLATELET
BASOS PCT: 0 %
Basophils Absolute: 0 10*3/uL (ref 0.0–0.1)
Eosinophils Absolute: 0.2 10*3/uL (ref 0.0–0.7)
Eosinophils Relative: 1 %
HEMATOCRIT: 29.4 % — AB (ref 36.0–46.0)
HEMOGLOBIN: 9.5 g/dL — AB (ref 12.0–15.0)
LYMPHS ABS: 2 10*3/uL (ref 0.7–4.0)
Lymphocytes Relative: 18 %
MCH: 28.2 pg (ref 26.0–34.0)
MCHC: 32.3 g/dL (ref 30.0–36.0)
MCV: 87.2 fL (ref 78.0–100.0)
MONO ABS: 0.8 10*3/uL (ref 0.1–1.0)
MONOS PCT: 7 %
NEUTROS ABS: 8.3 10*3/uL — AB (ref 1.7–7.7)
Neutrophils Relative %: 74 %
Platelets: 256 10*3/uL (ref 150–400)
RBC: 3.37 MIL/uL — ABNORMAL LOW (ref 3.87–5.11)
RDW: 14.9 % (ref 11.5–15.5)
WBC: 11.3 10*3/uL — ABNORMAL HIGH (ref 4.0–10.5)

## 2015-07-14 LAB — RAPID URINE DRUG SCREEN, HOSP PERFORMED
AMPHETAMINES: NOT DETECTED
BARBITURATES: NOT DETECTED
BENZODIAZEPINES: NOT DETECTED
Cocaine: NOT DETECTED
Opiates: NOT DETECTED
TETRAHYDROCANNABINOL: NOT DETECTED

## 2015-07-14 LAB — LIPASE, BLOOD: Lipase: 33 U/L (ref 11–51)

## 2015-07-14 LAB — AMYLASE: AMYLASE: 100 U/L (ref 28–100)

## 2015-07-14 MED ORDER — LACTATED RINGERS IV BOLUS (SEPSIS): 500.0000 mL | Freq: Once | INTRAVENOUS | Status: DC

## 2015-07-14 MED ORDER — ONDANSETRON HCL 4 MG PO TABS: 4.0000 mg | ORAL_TABLET | Freq: Three times a day (TID) | ORAL | Status: DC | PRN

## 2015-07-14 MED ORDER — FERROUS SULFATE 325 (65 FE) MG PO TBEC: 325.0000 mg | DELAYED_RELEASE_TABLET | Freq: Two times a day (BID) | ORAL | Status: AC

## 2015-07-14 MED ORDER — ONDANSETRON HCL 4 MG PO TABS: 4.0000 mg | ORAL_TABLET | Freq: Once | ORAL | Status: DC

## 2015-07-14 MED ORDER — ONDANSETRON HCL 4 MG/2ML IJ SOLN: 4.0000 mg | Freq: Once | INTRAMUSCULAR | Status: DC

## 2015-07-14 NOTE — Discharge Instructions (Signed)
Fetal Movement Counts  Patient Name: __________________________________________________ Patient Due Date: ____________________  Performing a fetal movement count is highly recommended in high-risk pregnancies, but it is good for every pregnant woman to do. Your health care provider may ask you to start counting fetal movements at 28 weeks of the pregnancy. Fetal movements often increase:  · After eating a full meal.  · After physical activity.  · After eating or drinking something sweet or cold.  · At rest.  Pay attention to when you feel the baby is most active. This will help you notice a pattern of your baby's sleep and wake cycles and what factors contribute to an increase in fetal movement. It is important to perform a fetal movement count at the same time each day when your baby is normally most active.   HOW TO COUNT FETAL MOVEMENTS  1. Find a quiet and comfortable area to sit or lie down on your left side. Lying on your left side provides the best blood and oxygen circulation to your baby.  2. Write down the day and time on a sheet of paper or in a journal.  3. Start counting kicks, flutters, swishes, rolls, or jabs in a 2-hour period. You should feel at least 10 movements within 2 hours.  4. If you do not feel 10 movements in 2 hours, wait 2-3 hours and count again. Look for a change in the pattern or not enough counts in 2 hours.  SEEK MEDICAL CARE IF:  · You feel less than 10 counts in 2 hours, tried twice.  · There is no movement in over an hour.  · The pattern is changing or taking longer each day to reach 10 counts in 2 hours.  · You feel the baby is not moving as he or she usually does.  Date: ____________ Movements: ____________ Start time: ____________ Finish time: ____________   Date: ____________ Movements: ____________ Start time: ____________ Finish time: ____________  Date: ____________ Movements: ____________ Start time: ____________ Finish time: ____________  Date: ____________ Movements:  ____________ Start time: ____________ Finish time: ____________  Date: ____________ Movements: ____________ Start time: ____________ Finish time: ____________  Date: ____________ Movements: ____________ Start time: ____________ Finish time: ____________  Date: ____________ Movements: ____________ Start time: ____________ Finish time: ____________  Date: ____________ Movements: ____________ Start time: ____________ Finish time: ____________   Date: ____________ Movements: ____________ Start time: ____________ Finish time: ____________  Date: ____________ Movements: ____________ Start time: ____________ Finish time: ____________  Date: ____________ Movements: ____________ Start time: ____________ Finish time: ____________  Date: ____________ Movements: ____________ Start time: ____________ Finish time: ____________  Date: ____________ Movements: ____________ Start time: ____________ Finish time: ____________  Date: ____________ Movements: ____________ Start time: ____________ Finish time: ____________  Date: ____________ Movements: ____________ Start time: ____________ Finish time: ____________   Date: ____________ Movements: ____________ Start time: ____________ Finish time: ____________  Date: ____________ Movements: ____________ Start time: ____________ Finish time: ____________  Date: ____________ Movements: ____________ Start time: ____________ Finish time: ____________  Date: ____________ Movements: ____________ Start time: ____________ Finish time: ____________  Date: ____________ Movements: ____________ Start time: ____________ Finish time: ____________  Date: ____________ Movements: ____________ Start time: ____________ Finish time: ____________  Date: ____________ Movements: ____________ Start time: ____________ Finish time: ____________   Date: ____________ Movements: ____________ Start time: ____________ Finish time: ____________  Date: ____________ Movements: ____________ Start time: ____________ Finish  time: ____________  Date: ____________ Movements: ____________ Start time: ____________ Finish time: ____________  Date: ____________ Movements: ____________ Start time:   ____________ Finish time: ____________  Date: ____________ Movements: ____________ Start time: ____________ Finish time: ____________  Date: ____________ Movements: ____________ Start time: ____________ Finish time: ____________  Date: ____________ Movements: ____________ Start time: ____________ Finish time: ____________   Date: ____________ Movements: ____________ Start time: ____________ Finish time: ____________  Date: ____________ Movements: ____________ Start time: ____________ Finish time: ____________  Date: ____________ Movements: ____________ Start time: ____________ Finish time: ____________  Date: ____________ Movements: ____________ Start time: ____________ Finish time: ____________  Date: ____________ Movements: ____________ Start time: ____________ Finish time: ____________  Date: ____________ Movements: ____________ Start time: ____________ Finish time: ____________  Date: ____________ Movements: ____________ Start time: ____________ Finish time: ____________   Date: ____________ Movements: ____________ Start time: ____________ Finish time: ____________  Date: ____________ Movements: ____________ Start time: ____________ Finish time: ____________  Date: ____________ Movements: ____________ Start time: ____________ Finish time: ____________  Date: ____________ Movements: ____________ Start time: ____________ Finish time: ____________  Date: ____________ Movements: ____________ Start time: ____________ Finish time: ____________  Date: ____________ Movements: ____________ Start time: ____________ Finish time: ____________  Date: ____________ Movements: ____________ Start time: ____________ Finish time: ____________   Date: ____________ Movements: ____________ Start time: ____________ Finish time: ____________  Date: ____________  Movements: ____________ Start time: ____________ Finish time: ____________  Date: ____________ Movements: ____________ Start time: ____________ Finish time: ____________  Date: ____________ Movements: ____________ Start time: ____________ Finish time: ____________  Date: ____________ Movements: ____________ Start time: ____________ Finish time: ____________  Date: ____________ Movements: ____________ Start time: ____________ Finish time: ____________  Date: ____________ Movements: ____________ Start time: ____________ Finish time: ____________   Date: ____________ Movements: ____________ Start time: ____________ Finish time: ____________  Date: ____________ Movements: ____________ Start time: ____________ Finish time: ____________  Date: ____________ Movements: ____________ Start time: ____________ Finish time: ____________  Date: ____________ Movements: ____________ Start time: ____________ Finish time: ____________  Date: ____________ Movements: ____________ Start time: ____________ Finish time: ____________  Date: ____________ Movements: ____________ Start time: ____________ Finish time: ____________     This information is not intended to replace advice given to you by your health care provider. Make sure you discuss any questions you have with your health care provider.     Document Released: 09/19/2006 Document Revised: 09/10/2014 Document Reviewed: 06/16/2012  Elsevier Interactive Patient Education ©2016 [employer]

## 2015-07-14 NOTE — MAU Provider Note (Signed)
Taylor Klein is a 34 y.o. G2P1 at 37.4 weeks presented to MAU announced c/o n/v 1 time per day x 3 day and diarrhea today.  Denies vb, lof, or ctx w/+fm.  The FOB has a strong smell of TCH.  The MAU staff was complaining and reported getting sick.  Pt informed that he will not be allowed in the hospital with such a strong smell of TCH.  Pt understand and agreed.   History     Patient Active Problem List   Diagnosis Date Noted  . Breast abscess 08/14/2011    No chief complaint on file.  HPI  OB History    Gravida Para Term Preterm AB TAB SAB Ectopic Multiple Living   2               Past Medical History  Diagnosis Date  . Asthma   . Breast abscess of female   . Allergy   . PCOS (polycystic ovarian syndrome)     Past Surgical History  Procedure Laterality Date  . Cesarean section  03/19/09  . Breast surgery      Family History  Problem Relation Age of Onset  . Hypertension Mother   . Hypertension Maternal Aunt   . Hypertension Maternal Uncle   . Hypertension Maternal Grandmother     Social History  Substance Use Topics  . Smoking status: Never Smoker   . Smokeless tobacco: Never Used  . Alcohol Use: No    Allergies:  Allergies  Allergen Reactions  . Iodine Anaphylaxis  . Shellfish Allergy Anaphylaxis  . Latex Rash    Prescriptions prior to admission  Medication Sig Dispense Refill Last Dose  . amoxicillin-clavulanate (AUGMENTIN) 875-125 MG per tablet Take 1 tablet by mouth 2 (two) times daily. (Patient not taking: Reported on 07/13/2015) 20 tablet 0 Completed Course at Unknown time  . Prenatal Vit-Fe Fumarate-FA (PRENATAL MULTIVITAMIN) TABS tablet Take 1 tablet by mouth daily at 12 noon.       ROS See HPI above, all other systems are negative  Physical Exam   Blood pressure 116/72, pulse 88, temperature 98.1 F (36.7 C), temperature source Oral, resp. rate 20, height 5' (1.524 m), weight 229 lb (103.874 kg), SpO2 98 %.  Physical Exam Ext:   WNL ABD: Soft, non tender to palpation, no rebound or guarding SVE: deferred   ED Course  Assessment: IUP at  37.4weeks Membranes: intact FHR: Category 1 CTX:  Occasional.  Pt denies  Very strong smell of TCH in the room.     Plan: Labs: CBC, amylase, lipase  UDS    Sparrow Sanzo, CNM, MSN 07/14/2015. 11:17 PM   MAU Addendum Note  Results for orders placed or performed during the hospital encounter of 07/14/15 (from the past 24 hour(s))  CBC with Differential/Platelet     Status: Abnormal   Collection Time: 07/14/15 10:59 PM  Result Value Ref Range   WBC 11.3 (H) 4.0 - 10.5 K/uL   RBC 3.37 (L) 3.87 - 5.11 MIL/uL   Hemoglobin 9.5 (L) 12.0 - 15.0 g/dL   HCT 40.929.4 (L) 81.136.0 - 91.446.0 %   MCV 87.2 78.0 - 100.0 fL   MCH 28.2 26.0 - 34.0 pg   MCHC 32.3 30.0 - 36.0 g/dL   RDW 78.214.9 95.611.5 - 21.315.5 %   Platelets 256 150 - 400 K/uL   Neutrophils Relative % 74 %   Neutro Abs 8.3 (H) 1.7 - 7.7 K/uL   Lymphocytes Relative 18 %  Lymphs Abs 2.0 0.7 - 4.0 K/uL   Monocytes Relative 7 %   Monocytes Absolute 0.8 0.1 - 1.0 K/uL   Eosinophils Relative 1 %   Eosinophils Absolute 0.2 0.0 - 0.7 K/uL   Basophils Relative 0 %   Basophils Absolute 0.0 0.0 - 0.1 K/uL  Lipase, blood     Status: None   Collection Time: 07/14/15 10:59 PM  Result Value Ref Range   Lipase 33 11 - 51 U/L  Amylase     Status: None   Collection Time: 07/14/15 10:59 PM  Result Value Ref Range   Amylase 100 28 - 100 U/L    UDS pending anemic  Plan: -Rx for zofran and iron supplement -Discussed need to follow up in office  -Bleeding and labor Precautions -Encouraged to call if any questions or concerns arise prior to next scheduled office visit.  -Discharged to home in stable condition -Kick counts   Kyrsten Deleeuw, CNM, MSN 07/14/2015. 11:56 PM

## 2015-07-14 NOTE — MAU Note (Signed)
Pt reports since 8pm she has had vomiting and diarrhea. Denies fever.

## 2015-07-25 ENCOUNTER — Inpatient Hospital Stay (HOSPITAL_COMMUNITY): Admission: RE | Admit: 2015-07-25 | Payer: Self-pay | Source: Ambulatory Visit

## 2015-07-26 ENCOUNTER — Other Ambulatory Visit (HOSPITAL_COMMUNITY): Payer: Self-pay

## 2015-07-26 ENCOUNTER — Encounter (HOSPITAL_COMMUNITY)
Admission: RE | Admit: 2015-07-26 | Discharge: 2015-07-26 | Disposition: A | Discharge status: Home / Self Care | Payer: Medicaid Other | Source: Ambulatory Visit | Attending: Obstetrics and Gynecology | Admitting: Obstetrics and Gynecology

## 2015-07-26 ENCOUNTER — Encounter (HOSPITAL_COMMUNITY): Payer: Self-pay

## 2015-07-26 DIAGNOSIS — T4145XA Adverse effect of unspecified anesthetic, initial encounter: Secondary | ICD-10-CM | POA: Diagnosis not present

## 2015-07-26 DIAGNOSIS — Z91013 Allergy to seafood: Secondary | ICD-10-CM | POA: Diagnosis present

## 2015-07-26 DIAGNOSIS — O0930 Supervision of pregnancy with insufficient antenatal care, unspecified trimester: Secondary | ICD-10-CM

## 2015-07-26 DIAGNOSIS — L639 Alopecia areata, unspecified: Secondary | ICD-10-CM | POA: Diagnosis present

## 2015-07-26 DIAGNOSIS — T8859XA Other complications of anesthesia, initial encounter: Secondary | ICD-10-CM | POA: Diagnosis not present

## 2015-07-26 DIAGNOSIS — Z888 Allergy status to other drugs, medicaments and biological substances status: Secondary | ICD-10-CM

## 2015-07-26 DIAGNOSIS — Z91040 Latex allergy status: Secondary | ICD-10-CM | POA: Diagnosis present

## 2015-07-26 DIAGNOSIS — J45909 Unspecified asthma, uncomplicated: Secondary | ICD-10-CM | POA: Diagnosis not present

## 2015-07-26 DIAGNOSIS — Z98891 History of uterine scar from previous surgery: Secondary | ICD-10-CM

## 2015-07-26 DIAGNOSIS — Z6841 Body Mass Index (BMI) 40.0 and over, adult: Secondary | ICD-10-CM

## 2015-07-26 HISTORY — DX: Anemia, unspecified: D64.9

## 2015-07-26 LAB — CBC
HEMATOCRIT: 31.3 % — AB (ref 36.0–46.0)
Hemoglobin: 9.9 g/dL — ABNORMAL LOW (ref 12.0–15.0)
MCH: 27.6 pg (ref 26.0–34.0)
MCHC: 31.6 g/dL (ref 30.0–36.0)
MCV: 87.2 fL (ref 78.0–100.0)
PLATELETS: 254 10*3/uL (ref 150–400)
RBC: 3.59 MIL/uL — AB (ref 3.87–5.11)
RDW: 14.9 % (ref 11.5–15.5)
WBC: 8.7 10*3/uL (ref 4.0–10.5)

## 2015-07-26 LAB — TYPE AND SCREEN
ABO/RH(D): O POS
ANTIBODY SCREEN: NEGATIVE

## 2015-07-26 LAB — ABO/RH: ABO/RH(D): O POS

## 2015-07-26 NOTE — H&P (Signed)
Taylor Klein is a 34 y.o. female, G2P1001 at 3639 3/7 weeks, presenting on 07/27/15 for scheduled elective repeat cesarean.  Denies leaking or bleeding, reports +FM.  No HA, visual sx, or epigastric pain.  Patient Active Problem List   Diagnosis Date Noted  . Previous cesarean section 07/26/2015  . Anesthesia complication--failure of regional anesthesia with previous C/S 07/26/2015  . BMI 40.0-44.9, adult (HCC) 07/26/2015  . 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 Alopecia areata 07/26/2015  . Asthma 07/26/2015  . Late prenatal care 07/26/2015  . Breast abscess 08/14/2011    History of present pregnancy: Patient entered care at 21 5/7 weeks.  She had an US in MAU at 9 5/7 weeks, with EDC of 07/31/15.  Small SCH noted. EDC of 07/31/15 was established by US at 9 5/7 weeks.   Anatomy scan:  23 2/7 weeks, with limited anatomy and a posterior placenta, EFW 1+5, 56%ile, normal fluid and closed cervix.   Additional US evaluations:   27 5/7 weeks:  EFW 2+6, 30%ile, normal fluid, cervix closed, still unable to see placental cord insertion, RVOT, LVOT, and DA.  32 5/7 weeks:  EFW 4+15, 69.4%ile, AFI 16.13, 60%ile, cervix 3.32, vtx, technically difficult due to obesity and GA. Significant prenatal events:  Entered care at 21 5/7 weeks.  Provider unable to do pap due to patient discomfort.  Narrow pubic arch noted.  TDAP and flu vaccine declined.  Elected repeat C/S, although she has been concerned about her experience with her last C/S of failure of both epidural and spinal anesthesia, and the need for general anesthesia.  Had anesthesia visit at her pre-op appt.  No antenatal testing during her pregnancy. Last evaluation:  07/22/15--BP 104/66, weight 226.  OB History    Gravida Para Term Preterm AB TAB SAB Ectopic Multiple Living   2 1 1       1     2010--Primary LTCS due FTP, induced for postdates, progressed to 5-6, 24 hour  labor, 6+6, female, failure of epidural and spinal, required general anesthesia, delivered by Dr. Su Hiltoberts  Past Medical History  Diagnosis Date  . Breast abscess of female   . Allergy   . PCOS (polycystic ovarian syndrome)   . Asthma     [place]   . Anemia    Past Surgical History  Procedure Laterality Date  . Cesarean section  03/19/09  . Breast surgery     Family History: family history includes Hypertension in her maternal aunt, maternal grandmother, maternal uncle, and mother.   Social History:  reports that she has never smoked. She has never used smokeless tobacco. She reports that she does not drink alcohol or use illicit drugs.  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  FOB is [employer].   Prenatal Transfer Tool  Maternal Diabetes: No Genetic Screening: Normal Quad screen Maternal Ultrasounds/Referrals: Normal Fetal Ultrasounds or other Referrals:  None Maternal Substance Abuse:  No Significant Maternal Medications:  None Significant Maternal Lab Results: Lab values include: Group B Strep negative  TDAP Declined Flu Declined  ROS:  +FM, occasional   Allergies  Allergen Reactions  . Iodine Anaphylaxis  . Shellfish Allergy Anaphylaxis  . Latex Rash     VSS, afebrile.  Chest clear Heart RRR without murmur Abd gravid, NT, FH 39 cm Pelvic: Deferred Ext: WNL  FHR: 152 on last office visit  UCs:  Occasional per patient report  Prenatal labs: ABO, Rh: --/--/O POS (11/22 1050) Antibody: NEG (11/22 1050) Rubella:  Immune RPR: Nonreactive (07/22 0000)  HBsAg: Negative (07/22 0000)  HIV: Non-reactive (07/22 0000)  GBS:  Negative 07/13/15 Sickle cell/Hgb electrophoresis:  AA Pap:  2010 WNL--unable to do during pregnancy due to patient discomfort. GC:  Negative 03/25/15 Chlamydia:  Negative 03/25/15 Genetic screenings:  Quad screen WNL Glucola:  WNL Other:   Hgb 11.8 at NOB, 10.9  at 28 weeks   Assessment/Plan: IUP at 39 3/7 weeks Previous cesarean, desires repeat. Latex allergy--rash Iodine allergy--anaphylaxis Shellfish allergy--anaphylaxis BMI 42.4 Alopecia aerata Failure of regional anesthesia with previous C/S.   Plan: Admit to [facility] per consult with Dr. Su Hilt for repeat cesarean. Routine [facility] pre-op orders  Nyra Capes, [place] 07/26/2015, 10:46 PM

## 2015-07-26 NOTE — Patient Instructions (Signed)
Your procedure is scheduled on: July 27, 2015  [facility] at: 9:15 am   Pick up the phone at the desk and dial [phone].  Call this number if you have problems the morning of surgery: [phone].  Remember: Do NOT eat food: after midnight tonight  Do NOT drink clear liquids after:  Midnight tonight  Take these medicines the morning of surgery with a SIP OF WATER: none   Do NOT wear jewelry (body piercing), metal hair clips/bobby pins,or nail polish. Do NOT wear lotions, powders, or perfumes.  You may wear deoderant. Do NOT shave for 48 hours prior to surgery. Do NOT bring valuables to the hospital. Leave suitcase in car.  After surgery it may be brought to your room.  For patients admitted to the hospital, checkout time is 11:00 AM the day of discharge.

## 2015-07-26 NOTE — Patient Instructions (Addendum)
Your procedure is scheduled on: July 27, 2015  [facility] through [facility] at: 9:15 am   Pick up the phone at the desk and dial [phone].  Call this number if you have problems the morning of surgery: [phone].  Remember: Do NOT eat food: after midnight tonight  Do NOT drink clear liquids after:  Midnight tonight  Take these medicines the morning of surgery with a SIP OF WATER: none   Do NOT wear jewelry (body piercing), metal hair clips/bobby pins,or nail polish. Do NOT wear lotions, powders, or perfumes.  You may wear deoderant. Do NOT shave for 48 hours prior to surgery. Do NOT bring valuables to the hospital. Leave suitcase in car.  After surgery it may be brought to your room.  For patients admitted to the hospital, checkout time is 11:00 AM the day of discharge.  

## 2015-07-26 NOTE — Anesthesia Preprocedure Evaluation (Signed)
Anesthesia Evaluation  Patient identified by MRN, date of birth, ID band Patient awake    Reviewed: Allergy & Precautions, H&P , NPO status , Patient's Chart, lab work & pertinent test results  Airway Mallampati: II  TM Distance: >3 FB Neck ROM: full    Dental no notable dental hx.    Pulmonary asthma (resolved) ,    Pulmonary exam normal breath sounds clear to auscultation       Cardiovascular negative cardio ROS Normal cardiovascular exam Rhythm:regular Rate:Normal     Neuro/Psych negative neurological ROS  negative psych ROS   GI/Hepatic negative GI ROS, Neg liver ROS,   Endo/Other  Morbid obesity  Renal/GU negative Renal ROS  negative genitourinary   Musculoskeletal   Abdominal (+) + obese,   Peds  Hematology negative hematology ROS (+) anemia ,   Anesthesia Other Findings   Reproductive/Obstetrics (+) Pregnancy                             Anesthesia Physical Anesthesia Plan  ASA: III  Anesthesia Plan: Spinal   Post-op Pain Management:    Induction:   Airway Management Planned:   Additional Equipment:   Intra-op Plan:   Post-operative Plan:   Informed Consent: I have reviewed the patients History and Physical, chart, labs and discussed the procedure including the risks, benefits and alternatives for the proposed anesthesia with the patient or authorized representative who has indicated his/her understanding and acceptance.     Plan Discussed with: CRNA  Anesthesia Plan Comments:         Anesthesia Quick Evaluation

## 2015-07-27 ENCOUNTER — Inpatient Hospital Stay (HOSPITAL_COMMUNITY)
Admission: RE | Admit: 2015-07-27 | Discharge: 2015-07-30 | DRG: 765 | Disposition: A | Discharge status: Home / Self Care | Payer: Medicaid Other | Source: Ambulatory Visit | Attending: Obstetrics and Gynecology | Admitting: Obstetrics and Gynecology

## 2015-07-27 ENCOUNTER — Encounter (HOSPITAL_COMMUNITY)
Admission: RE | Disposition: A | Discharge status: Home / Self Care | Payer: Self-pay | Source: Ambulatory Visit | Attending: Obstetrics and Gynecology

## 2015-07-27 ENCOUNTER — Encounter (HOSPITAL_COMMUNITY): Payer: Self-pay | Admitting: *Deleted

## 2015-07-27 ENCOUNTER — Inpatient Hospital Stay (HOSPITAL_COMMUNITY): Payer: Medicaid Other | Admitting: Anesthesiology

## 2015-07-27 DIAGNOSIS — O34219 Maternal care for unspecified type scar from previous cesarean delivery: Secondary | ICD-10-CM | POA: Diagnosis present

## 2015-07-27 DIAGNOSIS — O99214 Obesity complicating childbirth: Secondary | ICD-10-CM | POA: Diagnosis present

## 2015-07-27 DIAGNOSIS — Z98891 History of uterine scar from previous surgery: Secondary | ICD-10-CM

## 2015-07-27 DIAGNOSIS — Z3A39 39 weeks gestation of pregnancy: Secondary | ICD-10-CM

## 2015-07-27 DIAGNOSIS — J45909 Unspecified asthma, uncomplicated: Secondary | ICD-10-CM | POA: Diagnosis not present

## 2015-07-27 DIAGNOSIS — Z6841 Body Mass Index (BMI) 40.0 and over, adult: Secondary | ICD-10-CM

## 2015-07-27 DIAGNOSIS — Z91013 Allergy to seafood: Secondary | ICD-10-CM | POA: Diagnosis not present

## 2015-07-27 DIAGNOSIS — Z91041 Radiographic dye allergy status: Secondary | ICD-10-CM

## 2015-07-27 DIAGNOSIS — Z8249 Family history of ischemic heart disease and other diseases of the circulatory system: Secondary | ICD-10-CM | POA: Diagnosis not present

## 2015-07-27 DIAGNOSIS — L639 Alopecia areata, unspecified: Secondary | ICD-10-CM | POA: Diagnosis present

## 2015-07-27 DIAGNOSIS — T8859XA Other complications of anesthesia, initial encounter: Secondary | ICD-10-CM | POA: Diagnosis not present

## 2015-07-27 DIAGNOSIS — T4145XA Adverse effect of unspecified anesthetic, initial encounter: Secondary | ICD-10-CM | POA: Diagnosis not present

## 2015-07-27 DIAGNOSIS — Z91040 Latex allergy status: Secondary | ICD-10-CM | POA: Diagnosis present

## 2015-07-27 DIAGNOSIS — Z888 Allergy status to other drugs, medicaments and biological substances status: Secondary | ICD-10-CM

## 2015-07-27 DIAGNOSIS — O0930 Supervision of pregnancy with insufficient antenatal care, unspecified trimester: Secondary | ICD-10-CM

## 2015-07-27 LAB — CBC
HCT: 31.6 % — ABNORMAL LOW (ref 36.0–46.0)
HEMATOCRIT: 32 % — AB (ref 36.0–46.0)
HEMOGLOBIN: 10.3 g/dL — AB (ref 12.0–15.0)
Hemoglobin: 10 g/dL — ABNORMAL LOW (ref 12.0–15.0)
MCH: 27.7 pg (ref 26.0–34.0)
MCH: 28 pg (ref 26.0–34.0)
MCHC: 31.6 g/dL (ref 30.0–36.0)
MCHC: 32.2 g/dL (ref 30.0–36.0)
MCV: 87.5 fL (ref 78.0–100.0)
MCV: 87 fL (ref 78.0–100.0)
PLATELETS: 242 10*3/uL (ref 150–400)
Platelets: 243 10*3/uL (ref 150–400)
RBC: 3.61 MIL/uL — ABNORMAL LOW (ref 3.87–5.11)
RBC: 3.68 MIL/uL — AB (ref 3.87–5.11)
RDW: 14.9 % (ref 11.5–15.5)
RDW: 14.9 % (ref 11.5–15.5)
WBC: 12.5 10*3/uL — AB (ref 4.0–10.5)
WBC: 14 10*3/uL — ABNORMAL HIGH (ref 4.0–10.5)

## 2015-07-27 LAB — RPR: RPR: NONREACTIVE

## 2015-07-27 SURGERY — Surgical Case
Anesthesia: Spinal

## 2015-07-27 MED ORDER — PHENYLEPHRINE 8 MG IN D5W 100 ML (0.08MG/ML) PREMIX OPTIME
INJECTION | INTRAVENOUS | Status: AC
Start: 2015-07-27 — End: 2015-07-27
  Filled 2015-07-27: qty 100

## 2015-07-27 MED ORDER — LIDOCAINE HCL 1 % IJ SOLN
INTRAMUSCULAR | Status: AC
  Filled 2015-07-27: qty 20

## 2015-07-27 MED ORDER — MEPERIDINE HCL 25 MG/ML IJ SOLN: 6.2500 mg | INTRAMUSCULAR | Status: DC | PRN

## 2015-07-27 MED ORDER — LACTATED RINGERS IV SOLN
INTRAVENOUS | Status: DC | PRN
  Administered 2015-07-27: 12:00:00 via INTRAVENOUS

## 2015-07-27 MED ORDER — LACTATED RINGERS IV SOLN
40.0000 [IU] | INTRAVENOUS | Status: DC | PRN
  Administered 2015-07-27: 40 [IU] via INTRAVENOUS

## 2015-07-27 MED ORDER — DIPHENHYDRAMINE HCL 50 MG/ML IJ SOLN: 12.5000 mg | INTRAMUSCULAR | Status: DC | PRN

## 2015-07-27 MED ORDER — SODIUM CHLORIDE 0.9 % IR SOLN
Status: DC | PRN
  Administered 2015-07-27: 1000 mL

## 2015-07-27 MED ORDER — SODIUM CHLORIDE 0.9 % IJ SOLN: 3.0000 mL | INTRAMUSCULAR | Status: DC | PRN

## 2015-07-27 MED ORDER — OXYCODONE-ACETAMINOPHEN 5-325 MG PO TABS: 2.0000 | ORAL_TABLET | ORAL | Status: DC | PRN

## 2015-07-27 MED ORDER — TETANUS-DIPHTH-ACELL PERTUSSIS 5-2.5-18.5 LF-MCG/0.5 IM SUSP: 0.5000 mL | Freq: Once | INTRAMUSCULAR | Status: DC

## 2015-07-27 MED ORDER — PHENYLEPHRINE HCL 10 MG/ML IJ SOLN
INTRAMUSCULAR | Status: DC | PRN
  Administered 2015-07-27: 80 ug via INTRAVENOUS

## 2015-07-27 MED ORDER — DIPHENHYDRAMINE HCL 25 MG PO CAPS
25.0000 mg | ORAL_CAPSULE | ORAL | Status: DC | PRN
  Administered 2015-07-28 (×2): 25 mg via ORAL
  Filled 2015-07-27 (×2): qty 1

## 2015-07-27 MED ORDER — MENTHOL 3 MG MT LOZG: 1.0000 | LOZENGE | OROMUCOSAL | Status: DC | PRN

## 2015-07-27 MED ORDER — NALBUPHINE HCL 10 MG/ML IJ SOLN: 5.0000 mg | INTRAMUSCULAR | Status: DC | PRN

## 2015-07-27 MED ORDER — ACETAMINOPHEN 500 MG PO TABS
1000.0000 mg | ORAL_TABLET | Freq: Four times a day (QID) | ORAL | Status: AC
  Administered 2015-07-27 – 2015-07-28 (×3): 1000 mg via ORAL
  Filled 2015-07-27 (×3): qty 2

## 2015-07-27 MED ORDER — SCOPOLAMINE 1 MG/3DAYS TD PT72
MEDICATED_PATCH | TRANSDERMAL | Status: AC
  Administered 2015-07-27: 1.5 mg via INTRADERMAL
  Filled 2015-07-27: qty 1

## 2015-07-27 MED ORDER — DIPHENHYDRAMINE HCL 50 MG/ML IJ SOLN
INTRAMUSCULAR | Status: AC
Start: 2015-07-27 — End: 2015-07-27
  Filled 2015-07-27: qty 1

## 2015-07-27 MED ORDER — NALBUPHINE HCL 10 MG/ML IJ SOLN
5.0000 mg | INTRAMUSCULAR | Status: DC | PRN
Start: 2015-07-27 — End: 2015-07-30

## 2015-07-27 MED ORDER — SENNOSIDES-DOCUSATE SODIUM 8.6-50 MG PO TABS
2.0000 | ORAL_TABLET | ORAL | Status: DC
  Administered 2015-07-27 – 2015-07-30 (×3): 2 via ORAL
  Filled 2015-07-27 (×3): qty 2

## 2015-07-27 MED ORDER — ONDANSETRON HCL 4 MG/2ML IJ SOLN: 4.0000 mg | Freq: Three times a day (TID) | INTRAMUSCULAR | Status: DC | PRN

## 2015-07-27 MED ORDER — MORPHINE SULFATE (PF) 0.5 MG/ML IJ SOLN
INTRAMUSCULAR | Status: AC
  Filled 2015-07-27: qty 10

## 2015-07-27 MED ORDER — MORPHINE SULFATE (PF) 0.5 MG/ML IJ SOLN
INTRAMUSCULAR | Status: DC | PRN
  Administered 2015-07-27: .2 mg via INTRATHECAL

## 2015-07-27 MED ORDER — LANOLIN HYDROUS EX OINT
1.0000 | TOPICAL_OINTMENT | CUTANEOUS | Status: DC | PRN
Start: 2015-07-27 — End: 2015-07-30

## 2015-07-27 MED ORDER — NALBUPHINE HCL 10 MG/ML IJ SOLN
5.0000 mg | Freq: Once | INTRAMUSCULAR | Status: AC | PRN
  Administered 2015-07-27: 5 mg via INTRAVENOUS
  Filled 2015-07-27: qty 1

## 2015-07-27 MED ORDER — SIMETHICONE 80 MG PO CHEW: 80.0000 mg | CHEWABLE_TABLET | ORAL | Status: DC | PRN

## 2015-07-27 MED ORDER — ZOLPIDEM TARTRATE 5 MG PO TABS: 5.0000 mg | ORAL_TABLET | Freq: Every evening | ORAL | Status: DC | PRN

## 2015-07-27 MED ORDER — SCOPOLAMINE 1 MG/3DAYS TD PT72: 1.0000 | MEDICATED_PATCH | Freq: Once | TRANSDERMAL | Status: DC

## 2015-07-27 MED ORDER — VITAMIN K1 1 MG/0.5ML IJ SOLN
INTRAMUSCULAR | Status: AC
  Filled 2015-07-27: qty 0.5

## 2015-07-27 MED ORDER — DIBUCAINE 1 % RE OINT: 1.0000 "application " | TOPICAL_OINTMENT | RECTAL | Status: DC | PRN

## 2015-07-27 MED ORDER — OXYTOCIN 40 UNITS IN LACTATED RINGERS INFUSION - SIMPLE MED: 62.5000 mL/h | INTRAVENOUS | Status: AC

## 2015-07-27 MED ORDER — PRENATAL MULTIVITAMIN CH
1.0000 | ORAL_TABLET | Freq: Every day | ORAL | Status: DC
  Administered 2015-07-28 – 2015-07-29 (×2): 1 via ORAL
  Filled 2015-07-27 (×2): qty 1

## 2015-07-27 MED ORDER — PHENYLEPHRINE 8 MG IN D5W 100 ML (0.08MG/ML) PREMIX OPTIME
INJECTION | INTRAVENOUS | Status: DC | PRN
  Administered 2015-07-27: 60 ug/min via INTRAVENOUS

## 2015-07-27 MED ORDER — LACTATED RINGERS IV SOLN: Freq: Once | INTRAVENOUS | Status: DC

## 2015-07-27 MED ORDER — ERYTHROMYCIN 5 MG/GM OP OINT
TOPICAL_OINTMENT | OPHTHALMIC | Status: AC
  Filled 2015-07-27: qty 1

## 2015-07-27 MED ORDER — PHENYLEPHRINE 40 MCG/ML (10ML) SYRINGE FOR IV PUSH (FOR BLOOD PRESSURE SUPPORT)
PREFILLED_SYRINGE | INTRAVENOUS | Status: AC
  Filled 2015-07-27: qty 10

## 2015-07-27 MED ORDER — NALOXONE HCL 0.4 MG/ML IJ SOLN: 0.4000 mg | INTRAMUSCULAR | Status: DC | PRN

## 2015-07-27 MED ORDER — LIDOCAINE-EPINEPHRINE (PF) 2 %-1:200000 IJ SOLN
INTRAMUSCULAR | Status: DC | PRN
  Administered 2015-07-27: 7 mL via INTRADERMAL

## 2015-07-27 MED ORDER — BUPIVACAINE IN DEXTROSE 0.75-8.25 % IT SOLN
INTRATHECAL | Status: DC | PRN
  Administered 2015-07-27: 1.5 mL via INTRATHECAL

## 2015-07-27 MED ORDER — FENTANYL CITRATE (PF) 100 MCG/2ML IJ SOLN
INTRAMUSCULAR | Status: DC | PRN
  Administered 2015-07-27: 10 ug via INTRATHECAL
  Administered 2015-07-27: 90 ug via INTRAVENOUS

## 2015-07-27 MED ORDER — ONDANSETRON HCL 4 MG/2ML IJ SOLN
INTRAMUSCULAR | Status: DC | PRN
  Administered 2015-07-27: 4 mg via INTRAVENOUS

## 2015-07-27 MED ORDER — LACTATED RINGERS IV SOLN
INTRAVENOUS | Status: DC | PRN
  Administered 2015-07-27 (×3): via INTRAVENOUS

## 2015-07-27 MED ORDER — ACETAMINOPHEN 325 MG PO TABS: 650.0000 mg | ORAL_TABLET | ORAL | Status: DC | PRN

## 2015-07-27 MED ORDER — PROMETHAZINE HCL 25 MG/ML IJ SOLN: 6.2500 mg | INTRAMUSCULAR | Status: DC | PRN

## 2015-07-27 MED ORDER — FENTANYL CITRATE (PF) 100 MCG/2ML IJ SOLN: 25.0000 ug | INTRAMUSCULAR | Status: DC | PRN

## 2015-07-27 MED ORDER — IBUPROFEN 600 MG PO TABS
600.0000 mg | ORAL_TABLET | Freq: Four times a day (QID) | ORAL | Status: DC
  Administered 2015-07-27 – 2015-07-30 (×11): 600 mg via ORAL
  Filled 2015-07-27 (×11): qty 1

## 2015-07-27 MED ORDER — WITCH HAZEL-GLYCERIN EX PADS: 1.0000 "application " | MEDICATED_PAD | CUTANEOUS | Status: DC | PRN

## 2015-07-27 MED ORDER — CEFAZOLIN SODIUM-DEXTROSE 2-3 GM-% IV SOLR
INTRAVENOUS | Status: DC | PRN
  Administered 2015-07-27: 2 g via INTRAVENOUS

## 2015-07-27 MED ORDER — NALOXONE HCL 2 MG/2ML IJ SOSY
1.0000 ug/kg/h | PREFILLED_SYRINGE | INTRAVENOUS | Status: DC | PRN
  Filled 2015-07-27: qty 2

## 2015-07-27 MED ORDER — FENTANYL CITRATE (PF) 100 MCG/2ML IJ SOLN
INTRAMUSCULAR | Status: AC
  Filled 2015-07-27: qty 2

## 2015-07-27 MED ORDER — SIMETHICONE 80 MG PO CHEW
80.0000 mg | CHEWABLE_TABLET | ORAL | Status: DC
  Administered 2015-07-27 – 2015-07-29 (×2): 80 mg via ORAL
  Filled 2015-07-27 (×3): qty 1

## 2015-07-27 MED ORDER — KETOROLAC TROMETHAMINE 30 MG/ML IJ SOLN
INTRAMUSCULAR | Status: AC
Start: 2015-07-27 — End: 2015-07-27
  Administered 2015-07-27: 30 mg via INTRAMUSCULAR
  Filled 2015-07-27: qty 1

## 2015-07-27 MED ORDER — KETOROLAC TROMETHAMINE 30 MG/ML IJ SOLN
30.0000 mg | Freq: Four times a day (QID) | INTRAMUSCULAR | Status: AC | PRN
  Administered 2015-07-27: 30 mg via INTRAMUSCULAR

## 2015-07-27 MED ORDER — OXYCODONE-ACETAMINOPHEN 5-325 MG PO TABS
1.0000 | ORAL_TABLET | ORAL | Status: DC | PRN
  Administered 2015-07-30: 1 via ORAL
  Filled 2015-07-27: qty 1

## 2015-07-27 MED ORDER — DIPHENHYDRAMINE HCL 50 MG/ML IJ SOLN
INTRAMUSCULAR | Status: DC | PRN
  Administered 2015-07-27: 12.5 mg via INTRAVENOUS

## 2015-07-27 MED ORDER — OXYTOCIN 10 UNIT/ML IJ SOLN
INTRAMUSCULAR | Status: AC
  Filled 2015-07-27: qty 4

## 2015-07-27 MED ORDER — LACTATED RINGERS IV SOLN
INTRAVENOUS | Status: DC
  Administered 2015-07-27: 23:00:00 via INTRAVENOUS

## 2015-07-27 MED ORDER — SIMETHICONE 80 MG PO CHEW
80.0000 mg | CHEWABLE_TABLET | Freq: Three times a day (TID) | ORAL | Status: DC
Start: 2015-07-27 — End: 2015-07-30
  Administered 2015-07-28 – 2015-07-30 (×8): 80 mg via ORAL
  Filled 2015-07-27 (×8): qty 1

## 2015-07-27 MED ORDER — KETOROLAC TROMETHAMINE 30 MG/ML IJ SOLN: 30.0000 mg | Freq: Four times a day (QID) | INTRAMUSCULAR | Status: AC | PRN

## 2015-07-27 MED ORDER — NALBUPHINE HCL 10 MG/ML IJ SOLN: 5.0000 mg | Freq: Once | INTRAMUSCULAR | Status: AC | PRN

## 2015-07-27 MED ORDER — DIPHENHYDRAMINE HCL 25 MG PO CAPS: 25.0000 mg | ORAL_CAPSULE | Freq: Four times a day (QID) | ORAL | Status: DC | PRN

## 2015-07-27 MED ORDER — ONDANSETRON HCL 4 MG/2ML IJ SOLN
INTRAMUSCULAR | Status: AC
  Filled 2015-07-27: qty 2

## 2015-07-27 MED ORDER — SCOPOLAMINE 1 MG/3DAYS TD PT72
1.0000 | MEDICATED_PATCH | Freq: Once | TRANSDERMAL | Status: DC
  Filled 2015-07-27: qty 1

## 2015-07-27 SURGICAL SUPPLY — 40 items
BENZOIN TINCTURE PRP APPL 2/3 (GAUZE/BANDAGES/DRESSINGS) ×2 IMPLANT
CLAMP CORD UMBIL (MISCELLANEOUS) IMPLANT
CLOTH BEACON ORANGE TIMEOUT ST (SAFETY) ×2 IMPLANT
CLSR STERI-STRIP ANTIMIC 1/2X4 (GAUZE/BANDAGES/DRESSINGS) ×2 IMPLANT
CONTAINER PREFILL 10% NBF 15ML (MISCELLANEOUS) IMPLANT
DRAPE SHEET LG 3/4 BI-LAMINATE (DRAPES) IMPLANT
DRSG OPSITE POSTOP 4X10 (GAUZE/BANDAGES/DRESSINGS) ×2 IMPLANT
DURAPREP 26ML APPLICATOR (WOUND CARE) ×2 IMPLANT
ELECT REM PT RETURN 9FT ADLT (ELECTROSURGICAL) ×2
ELECTRODE REM PT RTRN 9FT ADLT (ELECTROSURGICAL) ×1 IMPLANT
EXTRACTOR VACUUM M CUP 4 TUBE (SUCTIONS) IMPLANT
GLOVE BIO SURGEON STRL SZ7.5 (GLOVE) ×2 IMPLANT
GLOVE BIOGEL PI IND STRL 7.0 (GLOVE) ×1 IMPLANT
GLOVE BIOGEL PI IND STRL 7.5 (GLOVE) ×1 IMPLANT
GLOVE BIOGEL PI INDICATOR 7.0 (GLOVE) ×1
GLOVE BIOGEL PI INDICATOR 7.5 (GLOVE) ×1
GOWN STRL REUS W/TWL LRG LVL3 (GOWN DISPOSABLE) ×4 IMPLANT
HEMOSTAT SURGICEL 4X8 (HEMOSTASIS) ×2 IMPLANT
KIT ABG SYR 3ML LUER SLIP (SYRINGE) IMPLANT
NEEDLE HYPO 25X5/8 SAFETYGLIDE (NEEDLE) IMPLANT
NS IRRIG 1000ML POUR BTL (IV SOLUTION) ×2 IMPLANT
PACK C SECTION WH (CUSTOM PROCEDURE TRAY) ×2 IMPLANT
PAD OB MATERNITY 4.3X12.25 (PERSONAL CARE ITEMS) ×2 IMPLANT
PENCIL SMOKE EVAC W/HOLSTER (ELECTROSURGICAL) ×2 IMPLANT
RTRCTR C-SECT PINK 25CM LRG (MISCELLANEOUS) ×2 IMPLANT
SPONGE LAP 18X18 X RAY DECT (DISPOSABLE) ×2 IMPLANT
STRIP CLOSURE SKIN 1/2X4 (GAUZE/BANDAGES/DRESSINGS) ×2 IMPLANT
SUT CHROMIC 2 0 CT 1 (SUTURE) ×2 IMPLANT
SUT MNCRL AB 3-0 PS2 27 (SUTURE) ×2 IMPLANT
SUT PLAIN 0 NONE (SUTURE) IMPLANT
SUT PLAIN 2 0 XLH (SUTURE) ×2 IMPLANT
SUT VIC AB 0 CT1 36 (SUTURE) ×6 IMPLANT
SUT VIC AB 0 CTX 36 (SUTURE) ×3
SUT VIC AB 0 CTX36XBRD ANBCTRL (SUTURE) ×3 IMPLANT
SUT VIC AB 2-0 CT1 27 (SUTURE) ×3
SUT VIC AB 2-0 CT1 TAPERPNT 27 (SUTURE) ×3 IMPLANT
SUT VIC AB 2-0 SH 27 (SUTURE) ×2
SUT VIC AB 2-0 SH 27XBRD (SUTURE) ×2 IMPLANT
TOWEL OR 17X24 6PK STRL BLUE (TOWEL DISPOSABLE) ×2 IMPLANT
TRAY FOLEY CATH SILVER 14FR (SET/KITS/TRAYS/PACK) ×2 IMPLANT

## 2015-07-27 NOTE — Lactation Note (Signed)
This note was copied from the chart of Taylor Klein. Lactation Consultation Note  Patient Name: Taylor Klein EAVWU'JToday's Date: 07/27/2015 Reason for consult: Initial assessment Baby at 7 hr of life and mom reports baby is feeding well. Mom is very sleepy, education might need to be repeated. Mom has large, soft breast with flat compressible nipples. 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 She bf and pumped for him. She has plans to get a DEBP just in case she has the same issue again. Discussed feeding frequency, voids, breast changes, manual expression, and nipple care. Given lactation handouts and she is aware of OP services along with support group.   Maternal Data Has patient been taught Hand Expression?: Yes Does the patient have breastfeeding experience prior to this delivery?: Yes  Feeding Feeding Type: Breast Fed Length of feed: 10 min  LATCH Score/Interventions Latch: Repeated attempts needed to sustain latch, nipple held in mouth throughout feeding, stimulation needed to elicit sucking reflex. Intervention(s): Assist with latch  Audible Swallowing: A few with stimulation Intervention(s): Skin to skin  Type of Nipple: Flat Intervention(s): No intervention needed  Comfort (Breast/Nipple): Soft / non-tender     Hold (Positioning): Full assist, staff holds infant at breast Intervention(s): Support Pillows;Position options  LATCH Score: 5  Lactation Tools Discussed/Used WIC Program: Yes [facility]   Consult Status Consult Status: Follow-up Date: 07/28/15 Follow-up type: In-patient    Rulon Eisenmengerlizabeth E Agnes Klein 07/27/2015, 7:13 PM

## 2015-07-27 NOTE — Lactation Note (Signed)
This note was copied from the chart of Taylor Klein. Lactation Consultation Note  Patient Name: Taylor Lieutenant DiegoSherhea Riffel ZOXWR'UToday's Date: 07/27/2015  Applied #24 nipple shield to the L breast and baby was able to latch for about 10 minutes, baby was still going when [facility] room. Mom was able, with help to latch the baby. She stated that she could do it again without help. She has a Harmony to try to pull the nipple out before attempting latch. She still seem tired. She may need more education about the shield and the pump.    Maternal Data    Feeding Feeding Type: Breast Fed Length of feed: 5 min  LATCH Score/Interventions Latch: Repeated attempts needed to sustain latch, nipple held in mouth throughout feeding, stimulation needed to elicit sucking reflex. Intervention(s): Adjust position;Assist with latch  Audible Swallowing: A few with stimulation Intervention(s): Skin to skin;Hand expression  Type of Nipple: Inverted Intervention(s): Hand pump (nipple shield)  Comfort (Breast/Nipple): Soft / non-tender     Hold (Positioning): Assistance needed to correctly position infant at breast and maintain latch.  LATCH Score: 5  Lactation Tools Discussed/Used     Consult Status      Rulon Eisenmengerlizabeth E Karmine Kauer 07/27/2015, 11:13 PM

## 2015-07-27 NOTE — Progress Notes (Signed)
UR chart review completed.  

## 2015-07-27 NOTE — Transfer of Care (Signed)
Immediate Anesthesia Transfer of Care Note  Patient: Taylor Klein  Procedure(s) Performed: Procedure(s): CESAREAN SECTION (N/A)  Patient Location: PACU  Anesthesia Type:Spinal and Epidural  Level of Consciousness: awake, alert , oriented and patient cooperative  Airway & Oxygen Therapy: Patient Spontanous Breathing  Post-op Assessment: Report given to RN and Post -op Vital signs reviewed and stable  Post vital signs: Reviewed and stable  Last Vitals:  Filed Vitals:   07/27/15 0927  BP: 136/85  Pulse: 99  Temp: 36.8 C  Resp: 20    Complications: No apparent anesthesia complications

## 2015-07-27 NOTE — Anesthesia Postprocedure Evaluation (Signed)
Anesthesia Post Note  Patient: Taylor Klein  Procedure(s) Performed: Procedure(s) (LRB): CESAREAN SECTION (N/A)  Patient location during evaluation: PACU Anesthesia Type: Spinal and Epidural Level of consciousness: oriented and awake and alert Pain management: pain level controlled Vital Signs Assessment: post-procedure vital signs reviewed and stable Respiratory status: spontaneous breathing, respiratory function stable and patient connected to nasal cannula oxygen Cardiovascular status: blood pressure returned to baseline and stable Postop Assessment: No backache, No signs of nausea or vomiting, Patient able to bend at knees and Spinal receding Anesthetic complications: no    Last Vitals:  Filed Vitals:   07/27/15 1345 07/27/15 1400  BP: 125/77 120/78  Pulse: 86 71  Temp:    Resp: 21 20    Last Pain:  Filed Vitals:   07/27/15 1411  PainSc: 0-No pain                 Reino KentJudd, Swathi Dauphin J

## 2015-07-27 NOTE — Op Note (Signed)
Cesarean Section Procedure Note  Indications: P2 at 5139 3/7wks for repeat c-section  Pre-operative Diagnosis: Prior Cesarean Section   Post-operative Diagnosis: Prior Cesarean Section  Procedure: REPEAT CESAREAN SECTION  Surgeon: Taylor CohoAngela Wynter Isaacs, MD    Assistants: Alphonzo Severanceachel Stall, CNM  Anesthesia: Regional  Anesthesiologist: Taylor GreenspanBenjamin Judd, MD   Procedure Details  The patient was taken to the operating room secondary to scheduled repeat c-section after the risks, benefits, complications, treatment options, and expected outcomes were discussed with the patient.  The patient concurred with the proposed plan, giving informed consent which was signed and witnessed. The patient was taken to Operating Room One, identified as Taylor Klein and the procedure verified as C-Section Delivery. A Time Out was held and the above information confirmed.  After induction of anesthesia by obtaining a spinal, the patient was prepped and draped in the usual sterile manner. A Pfannenstiel skin incision was made and carried down through the subcutaneous tissue to the underlying layer of fascia.  The fascia was incised bilaterally and extended transversely bilaterally with the Mayo scissors. Kocher clamps were placed on the inferior aspect of the fascial incision and the underlying rectus muscle was separated from the fascia. The same was done on the superior aspect of the fascial incision.  The peritoneum was identified, entered bluntly and extended manually.  An Alexis self-retaining retractor was placed.  The utero-vesical peritoneal reflection was incised transversely and the bladder flap was bluntly freed from the lower uterine segment. A low transverse uterine incision was made with the scalpel and extended bilaterally with the bandage scissors.  The infant was delivered in vertex position without difficulty.  After the umbilical cord was clamped and cut, the infant was handed to the awaiting pediatricians.  Cord  blood was obtained for evaluation.  The placenta was removed intact and appeared to be within normal limits. The uterus was cleared of all clots and debris. The uterine incision was closed with running interlocking sutures of 0 Vicryl and a second imbricating layer was performed as well.   Bilateral tubes and ovaries appeared to be within normal limits.  Good hemostasis was noted.  Copious irrigation was performed until clear.  The peritoneum was repaired with 2-0 chromic via a running suture but came undone as I needed to isolate a bleeder on the rectus muscle which ultimately became hemostatic after several 2-0 vicryl stitches were placed.  The fascia was reapproximated with a running suture of 0 Vicryl. The subcutaneous tissue was reapproximated with 3 interrupted sutures of 2-0 plain.  The skin was reapproximated with a subcuticular suture of 3-0 monocryl.  Steristrips were applied.  Instrument, sponge, and needle counts were correct prior to abdominal closure and at the conclusion of the case.  The patient was awaiting transfer to the recovery room in good condition.  Findings: Live female infant with Apgars 10 at one minute and 10 at five minutes.  Normal appearing bilateral ovaries and fallopian tubes were noted.  Estimated Blood Loss:  400 ml         Drains: 150 cc         Total IV Fluids: 2500 ml         Specimens to Pathology: Placenta         Complications:  None; patient tolerated the procedure well.         Disposition: PACU - hemodynamically stable.         Condition: stable  Attending Attestation: I performed the procedure.

## 2015-07-27 NOTE — Anesthesia Procedure Notes (Signed)
Spinal Patient location during procedure: OR Staffing Anesthesiologist: Raeqwon Lux Performed by: anesthesiologist  Preanesthetic Checklist Completed: patient identified, site marked, surgical consent, pre-op evaluation, timeout performed, IV checked, risks and benefits discussed and monitors and equipment checked Spinal Block Patient position: sitting Prep: DuraPrep Patient monitoring: heart rate, continuous pulse ox and blood pressure Location: L4-5 Injection technique: single-shot Needle Needle type: Pencan  Needle gauge: 24 G Needle length: 9 cm Needle insertion depth: 7 cm Catheter at skin depth: 12 cm Assessment Sensory level: T6 Additional Notes Functioning IV was confirmed and monitors were applied. Sterile prep and drape, including hand hygiene, mask and sterile gloves were used. The patient was positioned and the spine was prepped. The skin was anesthetized with lidocaine.  Free flow of clear CSF was obtained prior to injecting local anesthetic into the CSF.  The spinal needle aspirated freely following injection.  The needle was carefully withdrawn.  The patient tolerated the procedure well. Consent was obtained prior to procedure with all questions answered and concerns addressed.  Taylor DessertBen Lennart Gladish, MD

## 2015-07-28 LAB — CBC
HCT: 30 % — ABNORMAL LOW (ref 36.0–46.0)
Hemoglobin: 9.4 g/dL — ABNORMAL LOW (ref 12.0–15.0)
MCH: 27.4 pg (ref 26.0–34.0)
MCHC: 31.3 g/dL (ref 30.0–36.0)
MCV: 87.5 fL (ref 78.0–100.0)
PLATELETS: 225 10*3/uL (ref 150–400)
RBC: 3.43 MIL/uL — AB (ref 3.87–5.11)
RDW: 15.2 % (ref 11.5–15.5)
WBC: 10.5 10*3/uL (ref 4.0–10.5)

## 2015-07-28 NOTE — Lactation Note (Signed)
This note was copied from the chart of Taylor Lieutenant DiegoSherhea Areola. Lactation Consultation Note  Patient Name: Taylor Klein UXLKG'MToday's Date: 07/28/2015 Reason for consult: Follow-up assessment  Follow-up with pt at 27 hrs; Ga 39.3; BW 6 lbs, 14.1 oz.  Midnight weight check 1% at 12 hrs old.  Mom has DEBP in room for extra stimulation and to pull nipple out prior to latching. Mom P2, Hx PCOS with oversupply with first child. Mom has inverted, flat nipples.  #24 nipple shield.  Pendulous breast tissue (per RN).  RN stated she was not able to hand express any colostrum.  Pt wearing shells.   RN stated she was able to get baby to latch this am without nipple shield but infant did not pull nipple out from being inverted by end of feeding.   TcB Bili-level >75th% 7.2@24  hrs in high intermediate risk zone.   Infant has breastfed x5 (10-25 min) + attempt x4 (3-6 min) in past 24 hrs; voids-5; stools-4 in past 24 hrs.   Infant asleep on mom's chest when LC entered room not cuing to feed.  Mom stated infant just fed 2 hrs ago. LC instructed mom to call [facility] feeding [facility] of feeding.     Consult Status Consult Status: Follow-up Date: 07/28/15 Follow-up type: In-patient    Lendon KaVann, Asley Baskerville Walker 07/28/2015, 3:35 PM

## 2015-07-28 NOTE — Plan of Care (Signed)
Problem: Activity: Goal: Will verbalize the importance of balancing activity with adequate rest periods progressing  Problem: Nutritional: Goal: Mother's verbalization of comfort with breastfeeding process will improve 24 NS & hand pump

## 2015-07-28 NOTE — Anesthesia Postprocedure Evaluation (Signed)
Anesthesia Post Note  Patient: Taylor Klein  Procedure(s) Performed: Procedure(s) (LRB): CESAREAN SECTION (N/A)  Patient location during evaluation: Mother Baby Anesthesia Type: Spinal Level of consciousness: awake and alert and oriented Pain management: pain level controlled Vital Signs Assessment: post-procedure vital signs reviewed and stable Respiratory status: spontaneous breathing Cardiovascular status: stable Postop Assessment: No headache, No backache and Adequate PO intake Anesthetic complications: no    Last Vitals:  Filed Vitals:   07/27/15 2327 07/28/15 0350  BP: 119/64 90/54  Pulse: 88 61  Temp:  36.9 C  Resp:  20    Last Pain:  Filed Vitals:   07/28/15 0613  PainSc: 0-No pain                 Denisse Whitenack

## 2015-07-28 NOTE — Progress Notes (Signed)
Taylor Klein [id number] Lieutenant DiegoPostpartum Day 1 S/P Repeat Cesarean Section  Subjective: Patient up ad lib, denies syncope or dizziness. Reports consuming regular diet without issues and denies N/V. Patient reports no bowel movement , but is passing flatus.  Denies issues with urination and reports bleeding is "alright."  Patient is breast/pumpingfeeding and reports going well.  Desires for postpartum contraception not addressed.  Pain is being appropriately managed with use of motrin.   Objective: Temp:  [97.7 F (36.5 C)-98.5 F (36.9 C)] 97.7 F (36.5 C) (11/24 0745) Pulse Rate:  [58-90] 58 (11/24 0745) Resp:  [19-21] 20 (11/24 0745) BP: (90-129)/(51-86) 97/64 mmHg (11/24 0745) SpO2:  [95 %-99 %] 98 % (11/24 0745)   Recent Labs  07/27/15 1422 07/27/15 1728 07/28/15 0522  HGB 10.0* 10.3* 9.4*  HCT 31.6* 32.0* 30.0*  WBC 12.5* 14.0* 10.5    Physical Exam:  General: cooperative, fatigued and no distress Mood/Affect: Appropriate/Appropriate Lungs: clear to auscultation, no wheezes, rales or rhonchi, symmetric air entry.  Heart: normal rate and regular rhythm. Breast: breasts appear normal, no suspicious masses, no skin or nipple changes or axillary nodes. Abdomen:  Negative bowel sounds x 3Q, hypoactive in RUQ, Soft, Appropriately Tender, Mild Distention Incision:  Honeycomb dressing in place; Intact some drainage noted and outlined Uterine Fundus: firm, U/-3 Lochia: appropriate GU: Foley catheter in place draining, clear amber colored urine Skin: Warm, Dry. DVT Evaluation: No evidence of DVT seen on physical exam. SCD boots on and operational JP drain:   None LR infusing at 125mL  Assessment Post Operative Day 1 S/P Repeat C/S Normal Involution BreastFeeding Hemodynamically Stable  Plan: -Encouraged PO fluid intake -Will remove foley this afternoon -Encouraged ambulation -Continue to take pain medications -Continue other mgmt as ordered -Dr. Kathie RhodesS. Rivard to be  updated on patient status  Cherre RobinsJessica L Journi Moffa MSN, CNM 07/28/2015, 10:34 AM

## 2015-07-28 NOTE — Lactation Note (Signed)
This note was copied from the chart of Taylor Klein. Lactation Consultation Note  Patient Name: Taylor Lieutenant DiegoSherhea Riddle WUJWJ'XToday's Date: 07/28/2015 Reason for consult: Follow-up assessment Nurse called for LC to come help with latch. Mom was using hand pump on right breast when LC entered and baby was sucking on nurse's finger. As other LCs have noted, mom's breasts are very large and her nipples are inverted; also, mom stated that with her previous child, her nipple became everted after her full milk came in and wondered if that would happen again this time. The hand pump did not bring the right nipple out any. Tried latching baby on right breast without nipple shield in cross-cradle hold & baby eagerly latched but LC was unable to tell if infant was on mom's nipple or not. Mom stated she felt a strong tug. Baby was left suckling for ~10 mins and then LC unlatched - baby had been sucking on mom's areola but did not have her nipple in her mouth. Tried latching on same breast in football hold & again, baby latched on but did not stay latched. Tried using size 24 nipple shield on right breast and infant latched on it but after unlatching her after ~10 mins, no milk was seen in nipple shield. Mom willing to try cross-cradle/ laid back position on left breast. Mom had been wearing breast shells in her bra so her nipple came out slightly. Baby again was able to latch and stayed suckling for another ~10 mins but LC could not hear any swallows. Both LC & mom tried hand expression with no success at seeing any colostrum. Mom asked whether we should supplement the infant since we were unsure if she actually received any milk. Nurse brought in Alimentum and helped LC give infant ~9 mL of formula finger feeding with curved tip syringe while mom used DEBP. Mom did not have any droplets after pumping. Infant was left with mom after receiving the formula. Encouraged mom to continue trying to BF on cue and if she does not  hear swallows that she may need to have someone assist with giving her supplement either with her EBM or formula while mom used DEBP. Mom was very sleepy throughout entire LC visit and kept falling asleep while baby was suckling so LC helped hold baby close to the breast and compressed her breast tissue to allow infant to stayed latched and to make sure infant was able to breath. Mom stated she did not have WIC during pregnancy but plans to apply. Encouraged mom to call for LC help if desired at future feeds.  Maternal Data    Feeding Feeding Type: Breast Fed Length of feed: 15 min  LATCH Score/Interventions Latch: Repeated attempts needed to sustain latch, nipple held in mouth throughout feeding, stimulation needed to elicit sucking reflex. Intervention(s): Adjust position;Assist with latch;Breast compression  Audible Swallowing: None Intervention(s): Hand expression;Skin to skin  Type of Nipple: Inverted Intervention(s): Shells;Hand pump;Double electric pump  Comfort (Breast/Nipple): Soft / non-tender     Hold (Positioning): Assistance needed to correctly position infant at breast and maintain latch. Intervention(s): Breastfeeding basics reviewed;Support Pillows;Position options;Skin to skin  LATCH Score: 4  Lactation Tools Discussed/Used Tools: Shells;Pump;Nipple Shields Nipple shield size: 24 Shell Type: Inverted Breast pump type: Manual WIC Program: No (was not on during pregnancy)   Consult Status Consult Status: Follow-up Date: 07/29/15 Follow-up type: In-patient    Taylor Klein 07/28/2015, 7:15 PM

## 2015-07-28 NOTE — Addendum Note (Signed)
Addendum  created 07/28/15 0803 by Jhonnie GarnerBeth M Kirk Sampley, CRNA   Modules edited: Clinical Notes   Clinical Notes:  File: [id number]

## 2015-07-29 NOTE — Progress Notes (Addendum)
Taylor Klein [id number]  Subjective: Postpartum Day 2: S/p Repeat C/S  Patient up ad lib, reports no syncope or dizziness. C/o swollen, "puffy"  & tender abdomen. Is passing gas Feeding:  Breast Contraceptive plan:  undecided  Objective: Temp:  [97.7 F (36.5 C)-98.2 F (36.8 C)] 98.2 F (36.8 C) (11/25 11910613) Pulse Rate:  [57-84] 76 (11/25 0613) Resp:  [18-20] 18 (11/25 47820613) BP: (102-123)/(47-72) 123/66 mmHg (11/25 0613) SpO2:  [93 %-98 %] 93 % (11/24 1753)  CBC Latest Ref Rng 07/28/2015 07/27/2015 07/27/2015  WBC 4.0 - 10.5 K/uL 10.5 14.0(H) 12.5(H)  Hemoglobin 12.0 - 15.0 g/dL 9.5(A9.4(L) 10.3(L) 10.0(L)  Hematocrit 36.0 - 46.0 % 30.0(L) 32.0(L) 31.6(L)  Platelets 150 - 400 K/uL 225 243 242     Physical Exam:  General: alert and cooperative Lochia: appropriate Uterine Fundus: firm Abdomen:  + bowel sounds, tender  Incision: Honeycomb dressing CDI DVT Evaluation: No evidence of DVT seen on physical exam. Negative Homan's sign.   Assessment/Plan: Status post cesarean delivery, day 2. Stable Breast feeding Normal involution Continue current care. Plan for discharge tomorrow    Alphonzo SeveranceRachel Stall MSN, CNM 07/29/2015, 11:19 AM

## 2015-07-29 NOTE — Lactation Note (Signed)
This note was copied from the chart of Taylor Klein. Lactation Consultation Note Follow up visit at 54 hours of age.  Mom reports breastfeeding for 10 minutes before offering supplement. Mom thinks baby is not getting anything at the breast.  Encour[facility] mom [facility] to evaluate breast feedings better.  Mom has been supplementing with syringe and allowing milk to drop into baby's mouth.  Cautioned mom that baby needs to be sucking and can finger feed with syringe if needed.  Also discussed allowing baby to bottle feed due to volume and NS use by mom.  She reports intermittent use of NS.  Mom reports using a hand pump for 15 minutes after feedings.  Encouraged mom to use her DEBP at bedside to establish a good milk supply. [facility] and mom knows to call for assist as needed.     Patient Name: Taylor Lieutenant DiegoSherhea Sandra XBJYN'WToday's Date: 07/29/2015 Reason for consult: Follow-up assessment;Breast/nipple pain   Maternal Data    Feeding Feeding Type: Formula Length of feed: 10 min (mom reports breastfeedings for 10 minutes prior to supplemen)  LATCH Score/Interventions                      Lactation Tools Discussed/Used WIC Program: No   Consult Status Consult Status: Follow-up Date: 07/30/15 Follow-up type: In-patient    Jannifer RodneyShoptaw, Promyse Ardito Lynn 07/29/2015, 6:22 PM

## 2015-07-29 NOTE — Plan of Care (Signed)
Problem: Life Cycle: Goal: Chance of risk for complications during the postpartum period will decrease progressing     

## 2015-07-30 MED ORDER — BISACODYL 10 MG RE SUPP
10.0000 mg | Freq: Once | RECTAL | Status: AC
  Administered 2015-07-30: 10 mg via RECTAL
  Filled 2015-07-30: qty 1

## 2015-07-30 MED ORDER — OXYCODONE-ACETAMINOPHEN 5-325 MG PO TABS: 1.0000 | ORAL_TABLET | ORAL | Status: AC | PRN

## 2015-07-30 MED ORDER — SENNOSIDES-DOCUSATE SODIUM 8.6-50 MG PO TABS: 2.0000 | ORAL_TABLET | Freq: Every evening | ORAL | Status: AC | PRN

## 2015-07-30 MED ORDER — SIMETHICONE 80 MG PO CHEW: 80.0000 mg | CHEWABLE_TABLET | Freq: Three times a day (TID) | ORAL | Status: AC

## 2015-07-30 MED ORDER — IBUPROFEN 600 MG PO TABS: 600.0000 mg | ORAL_TABLET | Freq: Four times a day (QID) | ORAL | Status: AC

## 2015-07-30 NOTE — Lactation Note (Signed)
This note was copied from the chart of Taylor Klein. Lactation Consultation Note  Patient Name: Taylor Lieutenant DiegoSherhea Riedl ZOXWR'UToday's Date: 07/30/2015   Visited with Mom on day of discharge, baby 8870 hrs old.  Encouraged double pumping after breast feeding to support a full milk supply.  Mom states she is obtaining a DEBP (Medela) from her sister after discharge, to take all pump parts home with her.  Baby sleeping in crib at present.  Mom states baby is latching onto breast a bit better, but continues to supplement with Alimentum formula regularly.  History of first baby latching better once her milk volume increases, so is hoping this will happen again.  Reviewed a good latch with Mom.  Encouraged continued skin to skin , and cue based feedings.  Reminded her of normal cluster feeding, and encouraged small amounts of supplements so not to interfere with baby going to the breast.  OP lactation appointment made for 08/08/15 @ 9am.  Encouraged Mom to call earlier to inquire about a cancellation to be seen earlier.  Mom to call for assistance prior to discharge if needed.   Judee ClaraSmith, Stark Aguinaga E 07/30/2015, 10:37 AM

## 2015-07-30 NOTE — Discharge Summary (Signed)
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 yo G2P2002 at [redacted]w[redacted]d was admitted to the hospital 07/27/2015 for scheduled cesarean section with the following indication:Elective Repeat.  Membrane Rupture Time/Date: 11:49 AM ,07/27/2015   Patient delivered a Viable infant.07/27/2015  Details of operation can be found in separate operative note.  Pateint had an uncomplicated postpartum course.  She is ambulating, tolerating a regular diet, passing flatus, and urinating well. Patient is discharged home in stable condition on No discharge date for patient encounter.          Physical exam  Filed Vitals:   07/28/15 1753 07/29/15 0613 07/29/15 1829 07/30/15 0551  BP: 113/72 123/66 127/77 127/73  Pulse: 84 76 79 86  Temp: 98.1 F (36.7 C) 98.2 F (36.8 C) 98.3 F (36.8 C) 98.5 F (36.9 C)  TempSrc: Oral Oral Oral Oral  Resp: SpO2: 93%      General: alert and cooperative Lochia: appropriate Uterine Fundus: firm Incision: Healing well with no significant drainage DVT Evaluation: No evidence of DVT seen on physical exam. Labs: Lab Results  Component Value Date   WBC 10.5  07/28/2015   HGB 9.4* 07/28/2015   HCT 30.0* 07/28/2015   MCV 87.5 07/28/2015   PLT 225 07/28/2015   No flowsheet data found.  Discharge instruction: per After Visit Summary and "Baby and Me Booklet".  Medications:  Current facility-administered medications:  .  acetaminophen (TYLENOL) tablet 650 mg, 650 mg, Oral, Q4H PRN, Osborn Coho, MD .  witch hazel-glycerin (TUCKS) pad 1 application, 1 application, Topical, PRN **AND** dibucaine (NUPERCAINAL) 1 % rectal ointment 1 application, 1 application, Rectal, PRN, Osborn Coho, MD .  diphenhydrAMINE (BENADRYL) injection 12.5 mg, 12.5 mg, Intravenous, Q4H PRN **OR** diphenhydrAMINE (BENADRYL) capsule 25 mg, 25 mg, Oral, Q4H PRN, Karlyne Greenspan, MD, 25 mg at 07/28/15 1637 .  diphenhydrAMINE (BENADRYL) capsule 25 mg, 25 mg, Oral, Q6H PRN, Osborn Coho, MD .  ibuprofen (ADVIL,MOTRIN) tablet 600 mg, 600 mg, Oral, 4 times per day, Osborn Coho, MD, 600 mg at 07/30/15 0550 .  lactated ringers infusion, , Intravenous, Once, Karlyne Greenspan, MD .  lactated ringers infusion, , Intravenous, Continuous, Osborn Coho, MD, Last Rate: 125 mL/hr at 07/28/15 0800 .  lanolin ointment 1 application, 1 application, Topical, PRN, Osborn Coho, MD .  menthol-cetylpyridinium (CEPACOL) lozenge 3 mg, 1 lozenge, Oral, Q2H PRN, Osborn Coho, MD .  nalbuphine (NUBAIN) injection 5 mg, 5 mg, Intravenous, Q4H PRN **OR** nalbuphine (NUBAIN) injection 5 mg, 5 mg, Subcutaneous, Q4H PRN, Karlyne GreenspanBenjamin Judd, MD .  naloxone [facility](NARCAN) 2 mg in dextrose 5 % 250 mL infusion, 1-4 mcg/kg/hr, Intravenous, Continuous PRN, Karlyne GreenspanBenjamin Judd, MD .  naloxone [facility](NARCAN) injection 0.4 mg, 0.4 mg, Intravenous, PRN **AND** sodium chloride 0.9 % injection 3 mL, 3 mL, Intravenous, PRN, Karlyne GreenspanBenjamin Judd, MD .  ondansetron [facility](ZOFRAN) injection 4 mg, 4 mg, Intravenous, Q8H PRN, Karlyne GreenspanBenjamin Judd, MD .  oxyCODONE-acetaminophen (PERCOCET/ROXICET) 5-325 MG per tablet 1 tablet, 1 tablet, Oral, Q4H PRN, Osborn CohoAngela  Roberts, MD, 1 tablet at 07/30/15 0026 .  oxyCODONE-acetaminophen (PERCOCET/ROXICET) 5-325 MG per tablet 2 tablet, 2 tablet, Oral, Q4H PRN, Osborn CohoAngela Roberts, MD .  prenatal multivitamin tablet 1 tablet, 1 tablet, Oral, Q1200, Osborn CohoAngela Roberts, MD, 1 tablet at 07/29/15 1422 .  scopolamine (TRANSDERM-SCOP) 1 MG/3DAYS 1.5 mg, 1 patch, Transdermal, Once, Karlyne GreenspanBenjamin Judd, MD .  senna-docusate (Senokot-S) tablet 2 tablet, 2 tablet, Oral, Q24H, Osborn CohoAngela Roberts, MD, 2 tablet at 07/30/15 0026 .  simethicone (MYLICON) chewable tablet 80 mg, 80 mg, Oral, TID PC, Osborn CohoAngela Roberts, MD, 80 mg at 07/30/15 0844 .  simethicone (MYLICON) chewable tablet 80 mg, 80 mg, Oral, Q24H, Osborn CohoAngela Roberts, MD, 80 mg at 07/29/15 0025 .  simethicone (MYLICON) chewable tablet 80 mg, 80 mg, Oral, PRN, Osborn CohoAngela Roberts, MD .  zolpidem (AMBIEN) tablet 5 mg, 5 mg, Oral, QHS PRN, Osborn CohoAngela Roberts, MD After Visit Meds:    Medication List    STOP taking these medications        amoxicillin-clavulanate 875-125 MG tablet  Commonly known as:  AUGMENTIN     ondansetron 4 MG tablet  Commonly known as:  ZOFRAN      TAKE these medications        ferrous sulfate 325 (65 FE) MG EC tablet  Take 1 tablet (325 mg total) by mouth 2 (two) times daily.     ibuprofen 600 MG tablet  Commonly known as:  ADVIL,MOTRIN  Take 1 tablet (600 mg total) by mouth every 6 (six) hours.     oxyCODONE-acetaminophen 5-325 MG tablet  Commonly known as:  PERCOCET/ROXICET  Take 1 tablet by mouth every 4 (four) hours as needed (for pain scale 4-7).     prenatal multivitamin Tabs tablet  Take 1 tablet by mouth daily at 12 noon.     senna-docusate 8.6-50 MG tablet  Commonly known as:  Senokot-S  Take 2 tablets by mouth at bedtime as needed for mild constipation.     simethicone 80 MG chewable tablet  Commonly known as:  MYLICON  Chew 1 tablet (80 mg total) by mouth 3 (three) times daily after meals.        Diet: routine diet  Activity: Advance as  tolerated. Pelvic rest for 6 weeks.   Outpatient follow up:6 weeks Follow up Appt:No future appointments. Follow up visit: No Follow-up on file.  Postpartum contraception: Undecided  Newborn Data: Live born female  Birth Weight: 6 lb 14.1 oz (3120 g) APGAR: 10, 10  Baby Feeding: Breast Disposition:home with mother   07/30/2015 Jeriah Skufca, CNM     Care After Cesarean Delivery  Refer to this sheet in the next few weeks. These instructions provide you with information on caring for yourself after your procedure. Your caregiver may also give you specific instructions. Your treatment has been planned according to current medical practices, but problems sometimes occur. Call your caregiver if you have any  problems or questions after you go home. HOME CARE INSTRUCTIONS  Only take over-the-counter or prescription medicines as directed by your caregiver.  Do not drink alcohol, especially if you are breastfeeding or taking medicine to relieve pain.  Do not chew or smoke tobacco.  Continue to use good perineal care. Good perineal care includes:  Wiping your perineum from front to back.  Keeping your perineum clean.  Check your cut (incision) daily for increased redness, drainage, swelling, or separation of skin.  Clean your incision gently with soap and water every day, and then pat it dry. If your caregiver says it is okay, leave the incision uncovered. Use a bandage (dressing) if the incision is draining fluid or appears irritated. If the adhesive strips across the incision do not fall off within 7 days, carefully peel them off.  Hug a pillow when coughing or sneezing until your incision is healed. This helps to relieve pain.  Do not use tampons or douche until your caregiver says it is okay.  Shower, wash your hair, and take tub baths as directed by your caregiver.  Wear a well-fitting bra that provides breast support.  Limit wearing support panties or control-top  hose.  Drink enough fluids to keep your urine clear or pale yellow.  Eat high-fiber foods such as whole grain cereals and breads, brown rice, beans, and fresh fruits and vegetables every day. These foods may help prevent or relieve constipation.  Resume activities such as climbing stairs, driving, lifting, exercising, or traveling as directed by your caregiver.  Talk to your caregiver about resuming sexual activities. This is dependent upon your risk of infection, your rate of healing, and your comfort and desire to resume sexual activity.  Try to have someone help you with your household activities and your newborn for at least a few days after you leave the hospital.  Rest as much as possible. Try to rest or take a nap when your newborn is sleeping.  Increase your activities gradually.  Keep all of your scheduled postpartum appointments. It is very important to keep your scheduled follow-up appointments. At these appointments, your caregiver will be checking to make sure that you are healing physically and emotionally. SEEK MEDICAL CARE IF:   You are passing large clots from your vagina. Save any clots to show your caregiver.  You have a foul smelling discharge from your vagina.  You have trouble urinating.  You are urinating frequently.  You have pain when you urinate.  You have a change in your bowel movements.  You have increasing redness, pain, or swelling near your incision.  You have pus draining from your incision.  Your incision is separating.  You have painful, hard, or reddened breasts.  You have a severe headache.  You have blurred vision or see spots.  You feel sad or depressed.  You have thoughts of hurting yourself or your newborn.  You have questions about your care, the care of your newborn, or medicines.  You are dizzy or lightheaded.  You have a rash.  You have pain, redness, or swelling at the site of the removed intravenous access (IV)  tube.  You have nausea or vomiting.  You stopped breastfeeding and have not had a menstrual period within 12 weeks of stopping.  You are not breastfeeding and have not had a menstrual period within 12 weeks of delivery.  You have a fever. SEEK IMMEDIATE MEDICAL CARE IF:  You have persistent pain.  You have chest pain.  You have shortness of breath.  You faint.  You have leg pain.  You have stomach pain.  Your vaginal bleeding saturates 2 or more sanitary pads in 1 hour. MAKE SURE YOU:   Understand these instructions.  Will watch your condition.  Will get help right away if you are not doing well or get worse. Document Released: 05/12/2002 Document Revised: 05/14/2012 Document Reviewed: 04/16/2012 [facility] Patient Information 2014 [place], [place].   Postpartum Depression and Baby Blues  The postpartum period begins right after the birth of a baby. During this time, there is often a great amount of joy and excitement. It is also a time of considerable changes in the life of the parent(s). Regardless of how many times a mother gives birth, each child brings new challenges and dynamics to the family. It is not unusual to have feelings of excitement accompanied by confusing shifts in moods, emotions, and thoughts. All mothers are at risk of developing postpartum depression or the "baby blues." These mood changes can occur right after giving birth, or they may occur many months after giving birth. The baby blues or postpartum depression can be mild or severe. Additionally, postpartum depression can resolve rather quickly, or it can be a long-term condition. CAUSES Elevated hormones and their rapid decline are thought to be a main cause of postpartum depression and the baby blues. There are a number of hormones that radically change during and after pregnancy. Estrogen and progesterone usually decrease immediately after delivering your baby. The level of thyroid hormone and various  cortisol steroids also rapidly drop. Other factors that play a major role in these changes include major life events and genetics.  RISK FACTORS If you have any of the following risks for the baby blues or postpartum depression, know what symptoms to watch out for during the postpartum period. Risk factors that may increase the likelihood of getting the baby blues or postpartum depression include: 1. Havinga personal or family history of depression. 2. Having depression while being pregnant. 3. Having premenstrual or oral contraceptive-associated mood issues. 4. Having exceptional life stress. 5. Having marital conflict. 6. Lacking a social support network. 7. Having a baby with special needs. 8. Having health problems such as diabetes. SYMPTOMS Baby blues symptoms include:  Brief fluctuations in mood, such as going from extreme happiness to sadness.  Decreased concentration.  Difficulty sleeping.  Crying spells, tearfulness.  Irritability.  Anxiety. Postpartum depression symptoms typically begin within the first month after giving birth. These symptoms include:  Difficulty sleeping or excessive sleepiness.  Marked weight loss.  Agitation.  Feelings of worthlessness.  Lack of interest in activity or food. Postpartum psychosis is a very concerning condition and can be dangerous. Fortunately, it is rare. Displaying any of the following symptoms is cause for immediate medical attention. Postpartum psychosis symptoms include:  Hallucinations and delusions.  Bizarre or disorganized behavior.  Confusion or disorientation. DIAGNOSIS  A diagnosis is made by an evaluation of your symptoms. There are no medical or lab tests that lead to a diagnosis, but there are various questionnaires that a caregiver may use to identify those with the baby blues, postpartum depression, or psychosis. Often times, a screening tool called the [place] Postnatal Depression Scale is used to diagnose  depression in the postpartum period.  TREATMENT The baby blues usually goes away on its own in 1 to 2 weeks. Social support is often all that is needed. You should be encouraged to get adequate sleep and rest. Occasionally, you may  be given medicines to help you sleep.  Postpartum depression requires treatment as it can last several months or longer if it is not treated. Treatment may include individual or group therapy, medicine, or both to address any social, physiological, and psychological factors that may play a role in the depression. Regular exercise, a healthy diet, rest, and social support may also be strongly recommended.  Postpartum psychosis is more serious and needs treatment right away. Hospitalization is often needed. HOME CARE INSTRUCTIONS  Get as much rest as you can. Nap when the baby sleeps.  Exercise regularly. Some women find yoga and walking to be beneficial.  Eat a balanced and nourishing diet.  Do little things that you enjoy. Have a cup of tea, take a bubble bath, read your favorite magazine, or listen to your favorite music.  Avoid alcohol.  Ask for help with household chores, cooking, grocery shopping, or running errands as needed. Do not try to do everything.  Talk to people close to you about how you are feeling. Get support from your partner, family members, friends, or other new moms.  Try to stay positive in how you think. Think about the things you are grateful for.  Do not spend a lot of time alone.  Only take medicine as directed by your caregiver.  Keep all your postpartum appointments.  Let your caregiver know if you have any concerns. SEEK MEDICAL CARE IF: You are having a reaction or problems with your medicine. SEEK IMMEDIATE MEDICAL CARE IF:  You have suicidal feelings.  You feel you may harm the baby or someone else. Document Released: 05/24/2004 Document Revised: 11/12/2011 Document Reviewed: 06/26/2011 [facility] Patient Information  2014 [place], [place].   Breastfeeding Deciding to breastfeed is one of the best choices you can make for you and your baby. A change in hormones during pregnancy causes your breast tissue to grow and increases the number and size of your milk ducts. These hormones also allow proteins, sugars, and fats from your blood supply to make breast milk in your milk-producing glands. Hormones prevent breast milk from being released before your baby is born as well as prompt milk flow after birth. Once breastfeeding has begun, thoughts of your baby, as well as his or her sucking or crying, can stimulate the release of milk from your milk-producing glands.  BENEFITS OF BREASTFEEDING For Your Baby  Your first milk (colostrum) helps your baby's digestive system function better.   There are antibodies in your milk that help your baby fight off infections.   Your baby has a lower incidence of asthma, allergies, and sudden infant death syndrome.   The nutrients in breast milk are better for your baby than infant formulas and are designed uniquely for your baby's needs.   Breast milk improves your baby's brain development.   Your baby is less likely to develop other conditions, such as childhood obesity, asthma, or type 2 diabetes mellitus.  For You   Breastfeeding helps to create a very special bond between you and your baby.   Breastfeeding is convenient. Breast milk is always available at the correct temperature and costs nothing.   Breastfeeding helps to burn calories and helps you lose the weight gained during pregnancy.   Breastfeeding makes your uterus contract to its prepregnancy size faster and slows bleeding (lochia) after you give birth.   Breastfeeding helps to lower your risk of developing type 2 diabetes mellitus, osteoporosis, and breast or ovarian cancer later in life. SIGNS THAT YOUR  BABY IS HUNGRY Early Signs of Hunger  Increased alertness or  activity.  Stretching.  Movement of the head from side to side.  Movement of the head and opening of the mouth when the corner of the mouth or cheek is stroked (rooting).  Increased sucking sounds, smacking lips, cooing, sighing, or squeaking.  Hand-to-mouth movements.  Increased sucking of fingers or hands. Late Signs of Hunger  Fussing.  Intermittent crying. Extreme Signs of Hunger Signs of extreme hunger will require calming and consoling before your baby will be able to breastfeed successfully. Do not wait for the following signs of extreme hunger to occur before you initiate breastfeeding:   Restlessness.  A loud, strong cry.   Screaming.  BREASTFEEDING BASICS Breastfeeding Initiation  Find a comfortable place to sit or lie down, with your neck and back well supported.  Place a pillow or rolled up blanket under your baby to bring him or her to the level of your breast (if you are seated). Nursing pillows are specially designed to help support your arms and your baby while you breastfeed.  Make sure that your baby's abdomen is facing your abdomen.   Gently massage your breast. With your fingertips, massage from your chest wall toward your nipple in a circular motion. This encourages milk flow. You may need to continue this action during the feeding if your milk flows slowly.  Support your breast with 4 fingers underneath and your thumb above your nipple. Make sure your fingers are well away from your nipple and your baby's mouth.   Stroke your baby's lips gently with your finger or nipple.   When your baby's mouth is open wide enough, quickly bring your baby to your breast, placing your entire nipple and as much of the colored area around your nipple (areola) as possible into your baby's mouth.   More areola should be visible above your baby's upper lip than below the lower lip.   Your baby's tongue should be between his or her lower gum and your breast.    Ensure that your baby's mouth is correctly positioned around your nipple (latched). Your baby's lips should create a seal on your breast and be turned out (everted).  It is common for your baby to suck about 2-3 minutes in order to start the flow of breast milk. Latching Teaching your baby how to latch on to your breast properly is very important. An improper latch can cause nipple pain and decreased milk supply for you and poor weight gain in your baby. Also, if your baby is not latched onto your nipple properly, he or she may swallow some air during feeding. This can make your baby fussy. Burping your baby when you switch breasts during the feeding can help to get rid of the air. However, teaching your baby to latch on properly is still the best way to prevent fussiness from swallowing air while breastfeeding. Signs that your baby has successfully latched on to your nipple:    Silent tugging or silent sucking, without causing you pain.   Swallowing heard between every 3-4 sucks.    Muscle movement above and in front of his or her ears while sucking.  Signs that your baby has not successfully latched on to nipple:   Sucking sounds or smacking sounds from your baby while breastfeeding.  Nipple pain. If you think your baby has not latched on correctly, slip your finger into the corner of your baby's mouth to break the suction and place  it between your baby's gums. Attempt breastfeeding initiation again. Signs of Successful Breastfeeding Signs from your baby:   A gradual decrease in the number of sucks or complete cessation of sucking.   Falling asleep.   Relaxation of his or her body.   Retention of a small amount of milk in his or her mouth.   Letting go of your breast by himself or herself. Signs from you:  Breasts that have increased in firmness, weight, and size 1-3 hours after feeding.   Breasts that are softer immediately after breastfeeding.  Increased milk  volume, as well as a change in milk consistency and color by the fifth day of breastfeeding.   Nipples that are not sore, cracked, or bleeding. Signs That Your Pecola Leisure is Getting Enough Milk  Wetting at least 3 diapers in a 24-hour period. The urine should be clear and pale yellow by age 67 days.  At least 3 stools in a 24-hour period by age 67 days. The stool should be soft and yellow.  At least 3 stools in a 24-hour period by age 283 days. The stool should be seedy and yellow.  No loss of weight greater than 10% of birth weight during the first 22 days of age.  Average weight gain of 4-7 ounces (113-198 g) per week after age 28 days.  Consistent daily weight gain by age 67 days, without weight loss after the age of 2 weeks. After a feeding, your baby may spit up a small amount. This is common. BREASTFEEDING FREQUENCY AND DURATION Frequent feeding will help you make more milk and can prevent sore nipples and breast engorgement. Breastfeed when you feel the need to reduce the fullness of your breasts or when your baby shows signs of hunger. This is called "breastfeeding on demand." Avoid introducing a pacifier to your baby while you are working to establish breastfeeding (the first 4-6 weeks after your baby is born). After this time you may choose to use a pacifier. Research has shown that pacifier use during the first year of a baby's life decreases the risk of sudden infant death syndrome (SIDS). Allow your baby to feed on each breast as long as he or she wants. Breastfeed until your baby is finished feeding. When your baby unlatches or falls asleep while feeding from the first breast, offer the second breast. Because newborns are often sleepy in the first few weeks of life, you may need to awaken your baby to get him or her to feed. Breastfeeding times will vary from baby to baby. However, the following rules can serve as a guide to help you ensure that your baby is properly fed:  Newborns (babies 37  weeks of age or younger) may breastfeed every 1-3 hours.  Newborns should not go longer than 3 hours during the day or 5 hours during the night without breastfeeding.  You should breastfeed your baby a minimum of 8 times in a 24-hour period until you begin to introduce solid foods to your baby at around 65 months of age. BREAST MILK PUMPING Pumping and storing breast milk allows you to ensure that your baby is exclusively fed your breast milk, even at times when you are unable to breastfeed. This is especially important if you are going back to work while you are still breastfeeding or when you are not able to be present during feedings. Your lactation consultant can give you guidelines on how long it is safe to store breast milk.  A breast pump  is a machine that allows you to pump milk from your breast into a sterile bottle. The pumped breast milk can then be stored in a refrigerator or freezer. Some breast pumps are operated by hand, while others use electricity. Ask your lactation consultant which type will work best for you. Breast pumps can be purchased, but some hospitals and breastfeeding support groups lease breast pumps on a monthly basis. A lactation consultant can teach you how to hand express breast milk, if you prefer not to use a pump.  CARING FOR YOUR BREASTS WHILE YOU BREASTFEED Nipples can become dry, cracked, and sore while breastfeeding. The following recommendations can help keep your breasts moisturized and healthy:  Avoid using soap on your nipples.   Wear a supportive bra. Although not required, special nursing bras and tank tops are designed to allow access to your breasts for breastfeeding without taking off your entire bra or top. Avoid wearing underwire-style bras or extremely tight bras.  Air dry your nipples for 3-53minutes after each feeding.   Use only cotton bra pads to absorb leaked breast milk. Leaking of breast milk between feedings is normal.   Use lanolin on  your nipples after breastfeeding. Lanolin helps to maintain your skin's normal moisture barrier. If you use pure lanolin, you do not need to wash it off before feeding your baby again. Pure lanolin is not toxic to your baby. You may also hand express a few drops of breast milk and gently massage that milk into your nipples and allow the milk to air dry. In the first few weeks after giving birth, some women experience extremely full breasts (engorgement). Engorgement can make your breasts feel heavy, warm, and tender to the touch. Engorgement peaks within 3-5 days after you give birth. The following recommendations can help ease engorgement:  Completely empty your breasts while breastfeeding or pumping. You may want to start by applying warm, moist heat (in the shower or with warm water-soaked hand towels) just before feeding or pumping. This increases circulation and helps the milk flow. If your baby does not completely empty your breasts while breastfeeding, pump any extra milk after he or she is finished.  Wear a snug bra (nursing or regular) or tank top for 1-2 days to signal your body to slightly decrease milk production.  Apply ice packs to your breasts, unless this is too uncomfortable for you.  Make sure that your baby is latched on and positioned properly while breastfeeding. If engorgement persists after 48 hours of following these recommendations, contact your health care provider or a Advertising copywriter. OVERALL HEALTH CARE RECOMMENDATIONS WHILE BREASTFEEDING  Eat healthy foods. Alternate between meals and snacks, eating 3 of each per day. Because what you eat affects your breast milk, some of the foods may make your baby more irritable than usual. Avoid eating these foods if you are sure that they are negatively affecting your baby.  Drink milk, fruit juice, and water to satisfy your thirst (about 10 glasses a day).   Rest often, relax, and continue to take your prenatal vitamins to  prevent fatigue, stress, and anemia.  Continue breast self-awareness checks.  Avoid chewing and smoking tobacco.  Avoid alcohol and drug use. Some medicines that may be harmful to your baby can pass through breast milk. It is important to ask your health care provider before taking any medicine, including all over-the-counter and prescription medicine as well as vitamin and herbal supplements. It is possible to become pregnant while breastfeeding. If  birth control is desired, ask your health care provider about options that will be safe for your baby. SEEK MEDICAL CARE IF:   You feel like you want to stop breastfeeding or have become frustrated with breastfeeding.  You have painful breasts or nipples.  Your nipples are cracked or bleeding.  Your breasts are red, tender, or warm.  You have a swollen area on either breast.  You have a fever or chills.  You have nausea or vomiting.  You have drainage other than breast milk from your nipples.  Your breasts do not become full before feedings by the fifth day after you give birth.  You feel sad and depressed.  Your baby is too sleepy to eat well.  Your baby is having trouble sleeping.   Your baby is wetting less than 3 diapers in a 24-hour period.  Your baby has less than 3 stools in a 24-hour period.  Your baby's skin or the white part of his or her eyes becomes yellow.   Your baby is not gaining weight by 51 days of age. SEEK IMMEDIATE MEDICAL CARE IF:   Your baby is overly tired (lethargic) and does not want to wake up and feed.  Your baby develops an unexplained fever. Document Released: 08/20/2005 Document Revised: 08/25/2013 Document Reviewed: 02/11/2013 [facility] Patient Information 2015 [place], [place]. This information is not intended to replace advice given to you by your health care provider. Make sure you discuss any questions you have with your health care provider.

## 2015-07-30 NOTE — Discharge Instructions (Signed)
°Iron-Rich Diet ° °Iron is a mineral that helps your body to produce hemoglobin. Hemoglobin is a protein in your red blood cells that carries oxygen to your body's tissues. Eating too little iron may cause you to feel weak and tired, and it can increase your risk for infection. Eating enough iron is necessary for your body's metabolism, muscle function, and nervous system. °Iron is naturally found in many foods. It can also be added to foods or fortified in foods. There are two types of dietary iron: °· Heme iron. Heme iron is absorbed by the body more easily than nonheme iron. Heme iron is found in meat, poultry, and fish. °· Nonheme iron. Nonheme iron is found in dietary supplements, iron-fortified grains, beans, and vegetables. °You may need to follow an iron-rich diet if: °· You have been diagnosed with iron deficiency or iron-deficiency anemia. °· You have a condition that prevents you from absorbing dietary iron, such as: °¨ Infection in your intestines. °¨ Celiac disease. This involves long-lasting (chronic) inflammation of your intestines. °· You do not eat enough iron. °· You eat a diet that is high in foods that impair iron absorption. °· You have lost a lot of blood. °· You have heavy bleeding during your menstrual cycle. °· You are pregnant. °WHAT IS MY PLAN? °Your health care provider may help you to determine how much iron you need per day based on your condition. Generally, when a person consumes sufficient amounts of iron in the diet, the following iron needs are met: °· Men. °¨ 14-18 years old: 11 mg per day. °¨ 19-50 years old: 8 mg per day. °· Women.   °¨ 14-18 years old: 15 mg per day. °¨ 19-50 years old: 18 mg per day. °¨ Over 50 years old: 8 mg per day. °¨ Pregnant women: 27 mg per day. °¨ Breastfeeding women: 9 mg per day. °WHAT DO I NEED TO KNOW ABOUT AN IRON-RICH DIET? °· Eat fresh fruits and vegetables that are high in vitamin C along with foods that are high in iron. This will help  increase the amount of iron that your body absorbs from food, especially with foods containing nonheme iron. Foods that are high in vitamin C include oranges, peppers, tomatoes, and mango. °· Take iron supplements only as directed by your health care provider. Overdose of iron can be life-threatening. If you were prescribed iron supplements, take them with orange juice or a vitamin C supplement. °· Cook foods in pots and pans that are made from iron.   °· Eat nonheme iron-containing foods alongside foods that are high in heme iron. This helps to improve your iron absorption.   °· Certain foods and drinks contain compounds that impair iron absorption. Avoid eating these foods in the same meal as iron-rich foods or with iron supplements. These include: °¨ Coffee, black tea, and red wine. °¨ Milk, dairy products, and foods that are high in calcium. °¨ Beans, soybeans, and peas. °¨ Whole grains. °· When eating foods that contain both nonheme iron and compounds that impair iron absorption, follow these tips to absorb iron better.   °¨ Soak beans overnight before cooking. °¨ Soak whole grains overnight and drain them before using. °¨ Ferment flours before baking, such as using yeast in bread dough. °WHAT FOODS CAN I EAT? °Grains  °Iron-fortified breakfast cereal. Iron-fortified whole-wheat bread. Enriched rice. Sprouted grains. °Vegetables  °Spinach. Potatoes with skin. Green peas. Broccoli. Red and green bell peppers. Fermented vegetables. °Fruits  °Prunes. Raisins. Oranges. Strawberries. Mango. Grapefruit. °Meats and Other   Protein Sources Beef liver. Oysters. Beef. Shrimp. [place]. Chicken. [place]. Sardines. Chickpeas. Nuts. Tofu. Beverages Tomato juice. Fresh orange juice. Prune juice. Hibiscus tea. Fortified instant breakfast shakes. Condiments Tahini. Fermented soy sauce. Sweets and Desserts Black-strap molasses.  Other Wheat germ. The items listed above may not be a complete list of recommended foods or  beverages. Contact your dietitian for more options. WHAT FOODS ARE NOT RECOMMENDED? Grains Whole grains. Bran cereal. Bran flour. Oats. Vegetables Artichokes. Brussels sprouts. Kale. Fruits Blueberries. Raspberries. Strawberries. Figs. Meats and Other Protein Sources Soybeans. Products made from soy protein. Dairy Milk. Cream. Cheese. Yogurt. Cottage cheese. Beverages Coffee. Black tea. Red wine. Sweets and Desserts Cocoa. Chocolate. Ice cream. Other Basil. Oregano. Parsley. The items listed above may not be a complete list of foods and beverages to avoid. Contact your dietitian for more information.   This information is not intended to replace advice given to you by your health care provider. Make sure you discuss any questions you have with your health care provider.   Document Released: 04/03/2005 Document Revised: 09/10/2014 Document Reviewed: 03/17/2014 Elsevier Interactive Patient Education [employer].

## 2015-08-08 ENCOUNTER — Ambulatory Visit (HOSPITAL_COMMUNITY): Admit: 2015-08-08 | Payer: Medicaid Other

## 2015-08-15 ENCOUNTER — Ambulatory Visit (HOSPITAL_COMMUNITY): Payer: Medicaid Other

## 2016-10-06 DIAGNOSIS — A084 Viral intestinal infection, unspecified: Secondary | ICD-10-CM | POA: Diagnosis not present

## 2017-01-20 DIAGNOSIS — Y999 Unspecified external cause status: Secondary | ICD-10-CM | POA: Insufficient documentation

## 2017-01-20 DIAGNOSIS — Y939 Activity, unspecified: Secondary | ICD-10-CM | POA: Diagnosis not present

## 2017-01-20 DIAGNOSIS — S5011XA Contusion of right forearm, initial encounter: Secondary | ICD-10-CM | POA: Insufficient documentation

## 2017-01-20 DIAGNOSIS — S39012A Strain of muscle, fascia and tendon of lower back, initial encounter: Secondary | ICD-10-CM | POA: Diagnosis not present

## 2017-01-20 DIAGNOSIS — Z91040 Latex allergy status: Secondary | ICD-10-CM | POA: Insufficient documentation

## 2017-01-20 DIAGNOSIS — Y92410 Unspecified street and highway as the place of occurrence of the external cause: Secondary | ICD-10-CM | POA: Insufficient documentation

## 2017-01-20 DIAGNOSIS — J45909 Unspecified asthma, uncomplicated: Secondary | ICD-10-CM | POA: Insufficient documentation

## 2017-01-20 DIAGNOSIS — S59911A Unspecified injury of right forearm, initial encounter: Secondary | ICD-10-CM | POA: Diagnosis not present

## 2017-01-20 DIAGNOSIS — S3992XA Unspecified injury of lower back, initial encounter: Secondary | ICD-10-CM | POA: Diagnosis not present

## 2017-01-20 DIAGNOSIS — Z79899 Other long term (current) drug therapy: Secondary | ICD-10-CM | POA: Diagnosis not present

## 2017-01-21 ENCOUNTER — Emergency Department (HOSPITAL_BASED_OUTPATIENT_CLINIC_OR_DEPARTMENT_OTHER): Payer: 59

## 2017-01-21 ENCOUNTER — Encounter (HOSPITAL_BASED_OUTPATIENT_CLINIC_OR_DEPARTMENT_OTHER): Payer: Self-pay | Admitting: Emergency Medicine

## 2017-01-21 ENCOUNTER — Emergency Department (HOSPITAL_BASED_OUTPATIENT_CLINIC_OR_DEPARTMENT_OTHER)
Admission: EM | Admit: 2017-01-21 | Discharge: 2017-01-21 | Disposition: A | Discharge status: Home / Self Care | Payer: 59 | Attending: Emergency Medicine | Admitting: Emergency Medicine

## 2017-01-21 DIAGNOSIS — S5011XA Contusion of right forearm, initial encounter: Secondary | ICD-10-CM

## 2017-01-21 DIAGNOSIS — S39012A Strain of muscle, fascia and tendon of lower back, initial encounter: Secondary | ICD-10-CM

## 2017-01-21 DIAGNOSIS — S3992XA Unspecified injury of lower back, initial encounter: Secondary | ICD-10-CM | POA: Diagnosis not present

## 2017-01-21 DIAGNOSIS — S59911A Unspecified injury of right forearm, initial encounter: Secondary | ICD-10-CM | POA: Diagnosis not present

## 2017-01-21 LAB — URINALYSIS, ROUTINE W REFLEX MICROSCOPIC
BILIRUBIN URINE: NEGATIVE
GLUCOSE, UA: NEGATIVE mg/dL
HGB URINE DIPSTICK: NEGATIVE
KETONES UR: NEGATIVE mg/dL
Leukocytes, UA: NEGATIVE
Nitrite: NEGATIVE
PH: 5.5 (ref 5.0–8.0)
Protein, ur: 100 mg/dL — AB
Specific Gravity, Urine: 1.025 (ref 1.005–1.030)

## 2017-01-21 LAB — URINALYSIS, MICROSCOPIC (REFLEX)

## 2017-01-21 LAB — PREGNANCY, URINE: Preg Test, Ur: NEGATIVE

## 2017-01-21 MED ORDER — CYCLOBENZAPRINE HCL 10 MG PO TABS: 10.0000 mg | ORAL_TABLET | Freq: Three times a day (TID) | ORAL | 0 refills | Status: AC | PRN

## 2017-01-21 NOTE — Discharge Instructions (Signed)
Ibuprofen 600 mg every 6 hours as needed for pain. Flexeril as needed for pain not relieved with ibuprofen.  Follow-up with your primary Dr. if you're not improving in the next week.
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
## 2017-02-21 DIAGNOSIS — D649 Anemia, unspecified: Secondary | ICD-10-CM | POA: Diagnosis not present

## 2017-02-21 DIAGNOSIS — E038 Other specified hypothyroidism: Secondary | ICD-10-CM | POA: Diagnosis not present

## 2017-02-21 DIAGNOSIS — Z0000 Encounter for general adult medical examination without abnormal findings: Secondary | ICD-10-CM | POA: Diagnosis not present

## 2017-02-22 DIAGNOSIS — S8002XA Contusion of left knee, initial encounter: Secondary | ICD-10-CM | POA: Diagnosis not present

## 2017-02-22 DIAGNOSIS — S80211A Abrasion, right knee, initial encounter: Secondary | ICD-10-CM | POA: Diagnosis not present

## 2017-02-22 DIAGNOSIS — S8001XA Contusion of right knee, initial encounter: Secondary | ICD-10-CM | POA: Diagnosis not present

## 2017-03-29 ENCOUNTER — Ambulatory Visit: Payer: Self-pay | Admitting: Registered"

## 2017-05-31 DIAGNOSIS — D649 Anemia, unspecified: Secondary | ICD-10-CM | POA: Diagnosis not present

## 2017-05-31 DIAGNOSIS — E039 Hypothyroidism, unspecified: Secondary | ICD-10-CM | POA: Diagnosis not present

## 2017-06-24 DIAGNOSIS — H00021 Hordeolum internum right upper eyelid: Secondary | ICD-10-CM | POA: Diagnosis not present

## 2017-06-29 DIAGNOSIS — H00021 Hordeolum internum right upper eyelid: Secondary | ICD-10-CM | POA: Diagnosis not present

## 2017-07-16 DIAGNOSIS — K529 Noninfective gastroenteritis and colitis, unspecified: Secondary | ICD-10-CM | POA: Diagnosis not present

## 2018-03-12 ENCOUNTER — Emergency Department (HOSPITAL_COMMUNITY)
Admission: EM | Admit: 2018-03-12 | Discharge: 2018-03-12 | Disposition: A | Discharge status: Home / Self Care | Payer: 59 | Attending: Emergency Medicine | Admitting: Emergency Medicine

## 2018-03-12 ENCOUNTER — Encounter (HOSPITAL_COMMUNITY): Payer: Self-pay | Admitting: Emergency Medicine

## 2018-03-12 ENCOUNTER — Emergency Department (HOSPITAL_COMMUNITY): Payer: 59

## 2018-03-12 ENCOUNTER — Other Ambulatory Visit: Payer: Self-pay

## 2018-03-12 DIAGNOSIS — J181 Lobar pneumonia, unspecified organism: Secondary | ICD-10-CM | POA: Diagnosis not present

## 2018-03-12 DIAGNOSIS — J189 Pneumonia, unspecified organism: Secondary | ICD-10-CM

## 2018-03-12 DIAGNOSIS — Z91040 Latex allergy status: Secondary | ICD-10-CM | POA: Diagnosis not present

## 2018-03-12 DIAGNOSIS — J45909 Unspecified asthma, uncomplicated: Secondary | ICD-10-CM | POA: Insufficient documentation

## 2018-03-12 DIAGNOSIS — R0602 Shortness of breath: Secondary | ICD-10-CM | POA: Diagnosis present

## 2018-03-12 MED ORDER — ALBUTEROL (5 MG/ML) CONTINUOUS INHALATION SOLN
10.0000 mg/h | INHALATION_SOLUTION | RESPIRATORY_TRACT | Status: AC
  Administered 2018-03-12: 10 mg/h via RESPIRATORY_TRACT

## 2018-03-12 MED ORDER — BENZONATATE 100 MG PO CAPS
ORAL_CAPSULE | ORAL | Status: AC
  Filled 2018-03-12: qty 1

## 2018-03-12 MED ORDER — ALBUTEROL (5 MG/ML) CONTINUOUS INHALATION SOLN
INHALATION_SOLUTION | RESPIRATORY_TRACT | Status: AC
  Filled 2018-03-12: qty 20

## 2018-03-12 MED ORDER — DEXAMETHASONE SODIUM PHOSPHATE 10 MG/ML IJ SOLN
INTRAMUSCULAR | Status: AC
  Filled 2018-03-12: qty 1

## 2018-03-12 MED ORDER — IPRATROPIUM BROMIDE 0.02 % IN SOLN
RESPIRATORY_TRACT | Status: AC
  Filled 2018-03-12: qty 2.5

## 2018-03-12 MED ORDER — IPRATROPIUM BROMIDE 0.02 % IN SOLN
0.5000 mg | Freq: Once | RESPIRATORY_TRACT | Status: AC
  Administered 2018-03-12: 0.5 mg via RESPIRATORY_TRACT

## 2018-03-12 MED ORDER — ALBUTEROL SULFATE HFA 108 (90 BASE) MCG/ACT IN AERS
2.0000 | INHALATION_SPRAY | Freq: Once | RESPIRATORY_TRACT | Status: AC
  Administered 2018-03-12: 2 via RESPIRATORY_TRACT
  Filled 2018-03-12: qty 6.7

## 2018-03-12 MED ORDER — BENZONATATE 100 MG PO CAPS
100.0000 mg | ORAL_CAPSULE | Freq: Once | ORAL | Status: AC
  Administered 2018-03-12: 100 mg via ORAL

## 2018-03-12 MED ORDER — AMOXICILLIN-POT CLAVULANATE 875-125 MG PO TABS: 1.0000 | ORAL_TABLET | Freq: Two times a day (BID) | ORAL | 0 refills | Status: AC

## 2018-03-12 MED ORDER — AMOXICILLIN-POT CLAVULANATE 875-125 MG PO TABS
ORAL_TABLET | ORAL | Status: AC
  Filled 2018-03-12: qty 1

## 2018-03-12 MED ORDER — AMOXICILLIN-POT CLAVULANATE 875-125 MG PO TABS
1.0000 | ORAL_TABLET | Freq: Once | ORAL | Status: AC
  Administered 2018-03-12: 1 via ORAL

## 2018-03-12 MED ORDER — DEXAMETHASONE SODIUM PHOSPHATE 10 MG/ML IJ SOLN
10.0000 mg | Freq: Once | INTRAMUSCULAR | Status: AC
  Administered 2018-03-12: 10 mg via INTRAMUSCULAR

## 2018-03-12 MED ORDER — ALBUTEROL SULFATE (2.5 MG/3ML) 0.083% IN NEBU
5.0000 mg | INHALATION_SOLUTION | Freq: Once | RESPIRATORY_TRACT | Status: AC
  Administered 2018-03-12: 5 mg via RESPIRATORY_TRACT
  Filled 2018-03-12: qty 6

## 2018-03-12 NOTE — ED Provider Notes (Signed)
[facility] EMERGENCY DEPARTMENT Provider Note   CSN: [id number] Arrival date & time: 03/12/18  0204     History   Chief Complaint Chief Complaint  Patient presents with  . Asthma    HPI Taylor Klein is a 37 y.o. female.  37 year old female with a history of asthma, allergies, PCOS presents to the emergency department for worsening shortness of breath.  She states that symptoms have been progressing for the past 2 days.  She has noted some chest tightness with an associated nonproductive cough.  She was seen at urgent care yesterday for symptoms and was discharged with prescription for a cough medicine and albuterol inhaler.  She was unable to afford these prescriptions and did not have any other medication at home to use.  She believes that her symptoms have been aggravated recently from allergies, though she was around her daughter who was sick with pneumonia 1 week ago.  She has not had any fevers, hemoptysis, syncope, chest pain.  No nausea or vomiting.  Feels slightly better since receiving an albuterol nebulizer in triage.  The history is provided by the patient. No language interpreter was used.  Asthma     Past Medical History:  Diagnosis Date  . Allergy   . Anemia   . Asthma    resolved when she moved from [place]   . Breast abscess of female   . PCOS (polycystic ovarian syndrome)     Patient Active Problem List   Diagnosis Date Noted  . S/P repeat low transverse C-section 07/27/2015  . Previous cesarean section 07/26/2015  . Anesthesia complication--failure of regional anesthesia with previous C/S 07/26/2015  . BMI 40.0-44.9, adult (HCC) 07/26/2015  . 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 Alopecia areata 07/26/2015  . Asthma 07/26/2015  . Late prenatal care 07/26/2015  . Breast abscess 08/14/2011    Past Surgical History:  Procedure Laterality Date  . BREAST  SURGERY    . CESAREAN SECTION  03/19/09     OB History    Gravida  2   Para  2   Term  2   Preterm      AB      Living  2     SAB      TAB      Ectopic      Multiple  0   Live Births  2            Home Medications    Prior to Admission medications   Medication Sig Start Date End Date Taking? Authorizing Provider  Multiple Vitamin (MULTIVITAMIN WITH MINERALS) TABS tablet Take 1 tablet by mouth daily.   Yes [provider]  amoxicillin-clavulanate (AUGMENTIN) 875-125 MG tablet Take 1 tablet by mouth every 12 (twelve) hours. 03/12/18   Antony Madura, PA-C  cyclobenzaprine (FLEXERIL) 10 MG tablet Take 1 tablet (10 mg total) by mouth 3 (three) times daily as needed for muscle spasms. Patient not taking: Reported on 03/12/2018 01/21/17   Geoffery Lyons, MD  ferrous sulfate 325 (65 FE) MG EC tablet Take 1 tablet (325 mg total) by mouth 2 (two) times daily. Patient not taking: Reported on 03/12/2018 07/14/15 07/13/16  Standard, Venus, CNM  ibuprofen (ADVIL,MOTRIN) 600 MG tablet Take 1 tablet (600 mg total) by mouth every 6 (six) hours. Patient not taking: Reported on 03/12/2018 07/30/15   Standard, Venus, CNM  oxyCODONE-acetaminophen (PERCOCET/ROXICET) 5-325 MG tablet Take 1  tablet by mouth every 4 (four) hours as needed (for pain scale 4-7). Patient not taking: Reported on 03/12/2018 07/30/15   Standard, Venus, CNM  senna-docusate (SENOKOT-S) 8.6-50 MG tablet Take 2 tablets by mouth at bedtime as needed for mild constipation. Patient not taking: Reported on 03/12/2018 07/30/15   Standard, Venus, CNM  simethicone (MYLICON) 80 MG chewable tablet Chew 1 tablet (80 mg total) by mouth 3 (three) times daily after meals. Patient not taking: Reported on 03/12/2018 07/30/15   Standard, Venus, CNM    Family History Family History  Problem Relation Age of Onset  . Hypertension Mother   . Hypertension Maternal Aunt   . Hypertension Maternal Uncle   . Hypertension Maternal  Grandmother     Social History Social History   Tobacco Use  . Smoking status: Never Smoker  . Smokeless tobacco: Never Used  Substance Use Topics  . Alcohol use: No  . Drug use: No     Allergies   Iodine; Shellfish allergy; and Latex   Review of Systems Review of Systems Ten systems reviewed and are negative for acute change, except as noted in the HPI.    Physical Exam Updated Vital Signs BP 112/79 (BP Location: Right Wrist)   Pulse (!) 102   Temp 98.7 F (37.1 C) (Oral)   Resp 18   Ht 5' (1.524 m)   Wt 98 kg (216 lb)   LMP 03/07/2018   SpO2 96%   BMI 42.18 kg/m   Physical Exam  Constitutional: She is oriented to person, place, and time. She appears well-developed and well-nourished. No distress.  Obese, nontoxic.  HENT:  Head: Normocephalic and atraumatic.  Eyes: Conjunctivae and EOM are normal. No scleral icterus.  Neck: Normal range of motion.  Cardiovascular: Normal rate, regular rhythm and intact distal pulses.  Pulmonary/Chest: Effort normal. No respiratory distress. She has wheezes.  Diffuse expiratory wheeze. No respiratory distress. No hypoxia on room air. Chest expansion symmetric.  Musculoskeletal: Normal range of motion.  Neurological: She is alert and oriented to person, place, and time. She exhibits normal muscle tone. Coordination normal.  GCS 15. Moving all extremities spontaneously.  Skin: Skin is warm and dry. No rash noted. She is not diaphoretic. No erythema. No pallor.  Psychiatric: She has a normal mood and affect. Her behavior is normal.  Nursing note and vitals reviewed.    ED Treatments / Results  Labs (all labs ordered are listed, but only abnormal results are displayed) Labs Reviewed - No data to display   EKG ED ECG REPORT   Date: 03/12/2018  Rate: 95  Rhythm: normal sinus rhythm  QRS Axis: normal  Intervals: normal  ST/T Wave abnormalities: normal  Conduction Disutrbances:none  Narrative Interpretation: NSR; no  STEMI  Old EKG Reviewed: none available  I have personally reviewed the EKG tracing and agree with the computerized printout as noted.   Radiology Dg Chest 2 View  Result Date: 03/12/2018 CLINICAL DATA:  Shortness of breath.  Cough for 2 days. EXAM: CHEST - 2 VIEW COMPARISON:  Radiograph 09/21/2010 FINDINGS: Ill-defined right perihilar opacity, likely localizing to the middle lobe and suspicious for pneumonia. Left lung is clear. No pleural effusion or pneumothorax. No pulmonary edema. No acute osseous abnormalities. IMPRESSION: Findings suspicious for right middle lobe pneumonia. Electronically Signed   By: Rubye Oaks M.D.   On: 03/12/2018 02:45    Procedures Procedures (including critical care time)  Medications Ordered in ED Medications  albuterol (PROVENTIL,VENTOLIN) solution continuous neb (0  mg/hr Nebulization Stopped 03/12/18 0552)  albuterol (PROVENTIL HFA;VENTOLIN HFA) 108 (90 Base) MCG/ACT inhaler 2 puff (has no administration in time range)  albuterol (PROVENTIL) (2.5 MG/3ML) 0.083% nebulizer solution 5 mg (5 mg Nebulization Given 03/12/18 0215)  ipratropium (ATROVENT) nebulizer solution 0.5 mg (0.5 mg Nebulization Given 03/12/18 0438)  dexamethasone (DECADRON) injection 10 mg (10 mg Intramuscular Given 03/12/18 0429)  amoxicillin-clavulanate (AUGMENTIN) 875-125 MG per tablet 1 tablet (1 tablet Oral Given 03/12/18 0428)  benzonatate (TESSALON) capsule 100 mg (100 mg Oral Given 03/12/18 0428)    5:23 AM Symptomatic improvement following CAT. Lungs grossly clear. Will observe to ensure no rebound.  6:34 AM Lungs remain CTAB. Patient sleeping on recheck initially. Upon waking, denies worsening. SpO2 97% on room air.    Initial Impression / Assessment and Plan / ED Course  I have reviewed the triage vital signs and the nursing notes.  Pertinent labs & imaging results that were available during my care of the patient were reviewed by me and considered in my medical decision  making (see chart for details).     37 year old female presents for persistent shortness of breath.  Seen yesterday at urgent care, but was unable to get the prescription for albuterol inhaler filled due to cost.  She has not had any fevers, but reports increased cough.  She was exposed to her daughter who was recently diagnosed with pneumonia.  Chest x-ray today is also suggestive of community-acquired pneumonia in the right middle lobe.  Patient given continuous albuterol treatment as well as Decadron.  She has had symptomatic improvement with these medications.  Plan for initiation on Augmentin.  Patient to follow-up with her primary care doctor for recheck.  She has been instructed to return promptly for any worsening symptoms such as progressive dyspnea, fever, lightheadedness, syncope.  Return precautions discussed and provided. Patient discharged in stable condition with no unaddressed concerns.  Vitals:   03/12/18 0400 03/12/18 0415 03/12/18 0441 03/12/18 0627  BP: 122/84 107/73  112/79  Pulse: 85 82  (!) 102  Resp:  16  18  Temp:      TempSrc:      SpO2: 98% 98% 95% 96%  Weight:      Height:        Final Clinical Impressions(s) / ED Diagnoses   Final diagnoses:  Community acquired pneumonia of right middle lobe of lung [facility](HCC)    ED Discharge Orders        Ordered    amoxicillin-clavulanate (AUGMENTIN) 875-125 MG tablet  Every 12 hours     03/12/18 0625       Antony MaduraHumes, Lizzeth Meder, PA-C 03/12/18 [id number]    Palumbo, April, MD 03/12/18 0710

## 2018-03-12 NOTE — ED Triage Notes (Signed)
Pt reports she has hx of asthma, seen at PCP today supposed to be changing her medications. Pt reports she does not have an inhaler yet. Pt complaining of asthma attack, cough, shortness of breath.

## 2018-03-12 NOTE — Discharge Instructions (Signed)
Take Augmentin as prescribed until finished. Continue with 2 puffs of an albuterol inhaler every 4-6 hours for wheezing, chest tightness, shortness of breath. You may use the cough medications you were previously prescribed, but may also continue with over-the-counter medications. Return to the ED for worsening or concerning symptoms.

## 2018-03-12 NOTE — ED Notes (Signed)
Pt refusing discharge vitals.

## 2018-03-12 NOTE — ED Notes (Signed)
Patient transported to X-ray 
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
